# Patient Record
Sex: Female | Born: 1980 | Race: White | Hispanic: No | Marital: Married | Smoking: Former smoker
Health system: Southern US, Community
[De-identification: ages and names within clinical notes are randomized; demographics above are authoritative.]

## PROBLEM LIST (undated history)

## (undated) ENCOUNTER — Inpatient Hospital Stay (HOSPITAL_COMMUNITY): Payer: Self-pay

## (undated) DIAGNOSIS — Q210 Ventricular septal defect: Secondary | ICD-10-CM

## (undated) DIAGNOSIS — I471 Supraventricular tachycardia, unspecified: Secondary | ICD-10-CM

## (undated) DIAGNOSIS — E063 Autoimmune thyroiditis: Secondary | ICD-10-CM

## (undated) DIAGNOSIS — E039 Hypothyroidism, unspecified: Secondary | ICD-10-CM

## (undated) DIAGNOSIS — L709 Acne, unspecified: Secondary | ICD-10-CM

## (undated) HISTORY — DX: Ventricular septal defect: Q21.0

## (undated) HISTORY — DX: Acne, unspecified: L70.9

## (undated) HISTORY — DX: Autoimmune thyroiditis: E06.3

## (undated) HISTORY — PX: CARDIAC ELECTROPHYSIOLOGY MAPPING AND ABLATION: SHX1292

## (undated) HISTORY — PX: DILATION AND CURETTAGE OF UTERUS: SHX78

## (undated) HISTORY — DX: Supraventricular tachycardia, unspecified: I47.10

## (undated) HISTORY — PX: VSD REPAIR: SHX276

## (undated) HISTORY — DX: Supraventricular tachycardia: I47.1

## (undated) HISTORY — PX: CARDIAC CATHETERIZATION: SHX172

## (undated) HISTORY — PX: KNEE SURGERY: SHX244

---

## 2007-07-16 ENCOUNTER — Ambulatory Visit: Payer: Self-pay | Admitting: Internal Medicine

## 2007-07-28 ENCOUNTER — Ambulatory Visit: Payer: Self-pay | Admitting: Internal Medicine

## 2007-09-29 ENCOUNTER — Other Ambulatory Visit: Admission: RE | Admit: 2007-09-29 | Discharge: 2007-09-29 | Payer: Self-pay | Admitting: Gynecology

## 2008-11-17 ENCOUNTER — Emergency Department (HOSPITAL_COMMUNITY): Admission: EM | Admit: 2008-11-17 | Discharge: 2008-11-17 | Payer: Self-pay | Admitting: Emergency Medicine

## 2009-07-26 ENCOUNTER — Telehealth: Payer: Self-pay | Admitting: Internal Medicine

## 2009-10-06 ENCOUNTER — Ambulatory Visit (HOSPITAL_COMMUNITY): Admission: RE | Admit: 2009-10-06 | Discharge: 2009-10-06 | Payer: Self-pay | Admitting: Obstetrics and Gynecology

## 2009-10-15 ENCOUNTER — Inpatient Hospital Stay (HOSPITAL_COMMUNITY): Admission: AD | Admit: 2009-10-15 | Discharge: 2009-10-16 | Payer: Self-pay | Admitting: Obstetrics & Gynecology

## 2009-10-17 DEATH — deceased

## 2009-10-28 ENCOUNTER — Emergency Department (HOSPITAL_COMMUNITY): Admission: EM | Admit: 2009-10-28 | Discharge: 2009-10-28 | Payer: Self-pay | Admitting: Emergency Medicine

## 2010-08-20 ENCOUNTER — Inpatient Hospital Stay (HOSPITAL_COMMUNITY)
Admission: AD | Admit: 2010-08-20 | Discharge: 2010-08-23 | Payer: Self-pay | Source: Home / Self Care | Attending: Obstetrics and Gynecology | Admitting: Obstetrics and Gynecology

## 2010-08-24 ENCOUNTER — Inpatient Hospital Stay (HOSPITAL_COMMUNITY)
Admission: AD | Admit: 2010-08-24 | Discharge: 2010-08-24 | Payer: Self-pay | Source: Home / Self Care | Attending: Obstetrics and Gynecology | Admitting: Obstetrics and Gynecology

## 2010-09-10 ENCOUNTER — Inpatient Hospital Stay (HOSPITAL_COMMUNITY)
Admission: AD | Admit: 2010-09-10 | Discharge: 2010-09-10 | Payer: Self-pay | Source: Home / Self Care | Attending: Obstetrics and Gynecology | Admitting: Obstetrics and Gynecology

## 2010-09-12 ENCOUNTER — Inpatient Hospital Stay (HOSPITAL_COMMUNITY)
Admission: AD | Admit: 2010-09-12 | Discharge: 2010-09-12 | Payer: Self-pay | Source: Home / Self Care | Attending: Obstetrics and Gynecology | Admitting: Obstetrics and Gynecology

## 2010-09-13 ENCOUNTER — Inpatient Hospital Stay (HOSPITAL_COMMUNITY)
Admission: AD | Admit: 2010-09-13 | Discharge: 2010-09-15 | Payer: Self-pay | Source: Home / Self Care | Attending: Obstetrics and Gynecology | Admitting: Obstetrics and Gynecology

## 2010-09-13 DIAGNOSIS — E039 Hypothyroidism, unspecified: Secondary | ICD-10-CM

## 2010-09-13 DIAGNOSIS — E079 Disorder of thyroid, unspecified: Secondary | ICD-10-CM

## 2010-09-13 DIAGNOSIS — O99284 Endocrine, nutritional and metabolic diseases complicating childbirth: Secondary | ICD-10-CM

## 2010-11-26 LAB — URINALYSIS, ROUTINE W REFLEX MICROSCOPIC
Bilirubin Urine: NEGATIVE
Ketones, ur: NEGATIVE mg/dL
Nitrite: NEGATIVE
Urobilinogen, UA: 0.2 mg/dL (ref 0.0–1.0)

## 2010-11-26 LAB — CBC
HCT: 34.4 % — ABNORMAL LOW (ref 36.0–46.0)
Hemoglobin: 11.8 g/dL — ABNORMAL LOW (ref 12.0–15.0)
Hemoglobin: 12.6 g/dL (ref 12.0–15.0)
MCH: 30.1 pg (ref 26.0–34.0)
MCHC: 34.9 g/dL (ref 30.0–36.0)
Platelets: 161 10*3/uL (ref 150–400)
RBC: 3.88 MIL/uL (ref 3.87–5.11)
RDW: 12.7 % (ref 11.5–15.5)
RDW: 13 % (ref 11.5–15.5)
WBC: 12.3 10*3/uL — ABNORMAL HIGH (ref 4.0–10.5)

## 2010-11-26 LAB — RPR: RPR Ser Ql: NONREACTIVE

## 2010-11-26 LAB — ABO/RH: ABO/RH(D): O POS

## 2010-11-27 LAB — CBC
HCT: 36.4 % (ref 36.0–46.0)
MCH: 31.8 pg (ref 26.0–34.0)
MCHC: 34.1 g/dL (ref 30.0–36.0)
WBC: 12.4 10*3/uL — ABNORMAL HIGH (ref 4.0–10.5)

## 2010-11-27 LAB — RPR: RPR Ser Ql: NONREACTIVE

## 2010-12-03 LAB — CBC
Hemoglobin: 11.7 g/dL — ABNORMAL LOW (ref 12.0–15.0)
Hemoglobin: 13 g/dL (ref 12.0–15.0)
MCHC: 33.4 g/dL (ref 30.0–36.0)
MCHC: 33.7 g/dL (ref 30.0–36.0)
Platelets: 256 10*3/uL (ref 150–400)
RBC: 4.32 MIL/uL (ref 3.87–5.11)
RDW: 13.1 % (ref 11.5–15.5)
WBC: 9.8 10*3/uL (ref 4.0–10.5)

## 2010-12-03 LAB — URINALYSIS, ROUTINE W REFLEX MICROSCOPIC
Bilirubin Urine: NEGATIVE
Glucose, UA: NEGATIVE mg/dL
Ketones, ur: NEGATIVE mg/dL
Nitrite: NEGATIVE
Specific Gravity, Urine: 1.01 (ref 1.005–1.030)

## 2010-12-03 LAB — URINE MICROSCOPIC-ADD ON

## 2010-12-05 LAB — DIFFERENTIAL
Basophils Absolute: 0 10*3/uL (ref 0.0–0.1)
Basophils Relative: 0 % (ref 0–1)
Eosinophils Absolute: 0.2 10*3/uL (ref 0.0–0.7)
Eosinophils Relative: 2 % (ref 0–5)
Lymphs Abs: 2.7 10*3/uL (ref 0.7–4.0)
Neutrophils Relative %: 58 % (ref 43–77)

## 2010-12-05 LAB — URINALYSIS, ROUTINE W REFLEX MICROSCOPIC
Glucose, UA: NEGATIVE mg/dL
Ketones, ur: NEGATIVE mg/dL
Leukocytes, UA: NEGATIVE
Protein, ur: NEGATIVE mg/dL
Urobilinogen, UA: 1 mg/dL (ref 0.0–1.0)
pH: 6.5 (ref 5.0–8.0)

## 2010-12-05 LAB — URINE MICROSCOPIC-ADD ON

## 2010-12-05 LAB — BASIC METABOLIC PANEL
BUN: 8 mg/dL (ref 6–23)
CO2: 27 mEq/L (ref 19–32)
Creatinine, Ser: 0.63 mg/dL (ref 0.4–1.2)
Sodium: 137 mEq/L (ref 135–145)

## 2010-12-05 LAB — WET PREP, GENITAL: WBC, Wet Prep HPF POC: NONE SEEN

## 2010-12-05 LAB — CBC
MCHC: 34.8 g/dL (ref 30.0–36.0)
MCV: 90.1 fL (ref 78.0–100.0)
RDW: 13.2 % (ref 11.5–15.5)
WBC: 8 10*3/uL (ref 4.0–10.5)

## 2010-12-26 ENCOUNTER — Encounter: Payer: Self-pay | Admitting: Internal Medicine

## 2010-12-27 LAB — BASIC METABOLIC PANEL
CO2: 24 mEq/L (ref 19–32)
Calcium: 9.4 mg/dL (ref 8.4–10.5)
Creatinine, Ser: 0.65 mg/dL (ref 0.4–1.2)
GFR calc Af Amer: >60 mL/min (ref >60)
GFR calc non Af Amer: >60 mL/min (ref >60)
Sodium: 136 mEq/L (ref 135–145)

## 2010-12-27 LAB — CBC
Hemoglobin: 12.8 g/dL (ref 12.0–15.0)
MCHC: 35 g/dL (ref 30.0–36.0)
RBC: 4.11 MIL/uL (ref 3.87–5.11)
WBC: 7.4 10*3/uL (ref 4.0–10.5)

## 2010-12-27 LAB — DIFFERENTIAL
Basophils Relative: 0 % (ref 0–1)
Lymphocytes Relative: 38 % (ref 12–46)
Monocytes Absolute: 0.4 10*3/uL (ref 0.1–1.0)
Monocytes Relative: 5 % (ref 3–12)
Neutro Abs: 4.1 10*3/uL (ref 1.7–7.7)
Neutrophils Relative %: 56 % (ref 43–77)

## 2010-12-31 ENCOUNTER — Ambulatory Visit (INDEPENDENT_AMBULATORY_CARE_PROVIDER_SITE_OTHER): Payer: 59 | Admitting: Internal Medicine

## 2010-12-31 ENCOUNTER — Encounter: Payer: Self-pay | Admitting: Internal Medicine

## 2010-12-31 VITALS — BP 107/70 | HR 60 | Ht 66.0 in | Wt 130.0 lb

## 2010-12-31 DIAGNOSIS — Z9889 Other specified postprocedural states: Secondary | ICD-10-CM

## 2010-12-31 DIAGNOSIS — I471 Supraventricular tachycardia: Secondary | ICD-10-CM

## 2010-12-31 DIAGNOSIS — R002 Palpitations: Secondary | ICD-10-CM | POA: Insufficient documentation

## 2010-12-31 DIAGNOSIS — Z8774 Personal history of (corrected) congenital malformations of heart and circulatory system: Secondary | ICD-10-CM | POA: Insufficient documentation

## 2010-12-31 NOTE — Assessment & Plan Note (Signed)
Infrequent palpitations. At this point we'll not do anything.

## 2010-12-31 NOTE — Patient Instructions (Signed)
Your physician recommends that you schedule a follow-up appointment in: AS NEEDED  Your physician recommends that you continue on your current medications as directed. Please refer to the Current Medication list given to you today.  

## 2010-12-31 NOTE — Assessment & Plan Note (Signed)
There is no suggestion of the VSD repair is incomplete. She does have a murmur. I assume know that During evaluation at the [facility] [place] that there be something identified as relates to a persistent VSD. She'll look into her records

## 2010-12-31 NOTE — Progress Notes (Signed)
  HPI  Kaylee Ellis is a 30 y.o. female Seen after hiatus A 3-1/2 years for palpitations. Her cardiac history dates back to when she was 3. She had her VSD repair. He then had SVT and underwent ablation at the [facility] [place] for what was told her was unrelated to her VSD. When we saw her a couple years ago she was having palpitations. These have largely resolved as her life has become less stressful. She has occasional brief flutters that are aggravated by caffeine.  The patient denies chest pain, shortness of breath, nocturnal dyspnea, orthopnea or peripheral edema.  There have been no ightheadedness or syncope. ]   Past Medical History  Diagnosis Date  . Chest pain     history of, 2010  . Acne     history of  . Hashimoto disease     history of  . VSD (ventricular septal defect)     history of  . SVT (supraventricular tachycardia)     history of    Past Surgical History  Procedure Date  . Vsd repair     closure of ventricular septal defect  . Cardiac catheterization     for SVT    Current Outpatient Prescriptions  Medication Sig Dispense Refill  . levothyroxine (SYNTHROID, LEVOTHROID) 100 MCG tablet Take 100 mcg by mouth daily.        . Prenatal Vit-Fe Psac Cmplx-FA (PRENATAL MULTIVITAMIN) 60-1 MG tablet Take 1 tablet by mouth daily with breakfast.          No Known Allergies  Review of Systems negative except from HPI and PMH  Physical Exam Well developed and well nourished in no acute distress HENT normal E scleral and icterus clear Neck Supple JVP flat; carotids brisk and full Clear to ausculation Regular rate and rhythm, 2/6 murmur heard along the right sternal border Soft with active bowel sounds No clubbing cyanosis and edema Alert and oriented, grossly normal motor and sensory function Skin Warm and Dry  ECGSinus rhythm at 60 Intervals 0.16/1115.43 The axis is 60 Assessment and  Plan

## 2011-01-03 NOTE — Progress Notes (Signed)
Addended by: Deliah Goody on: 01/03/2011 12:25 PM   Modules accepted: Orders

## 2011-01-29 NOTE — Assessment & Plan Note (Signed)
[facility]                         ELECTROPHYSIOLOGY OFFICE NOTE   Kaylee, Ellis                        MRN:          [id number]  DATE:07/16/2007                            DOB:          [birth year]    Kaylee Ellis presents herself for new-patient evaluation.  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  Her cardiac history is notable for VSD repair at the age of 3  and an SVT ablation a few years ago, done in [place], for what she was  told was a problem unrelated to her VSD.  She has had recurrent problems  with palpitations of two sorts.  One is a slow heart rate, which she  notes when she feels fatigued, and the other is a fast heart rate that  lasts less than 5 seconds and is not really associated with any  significant symptoms.   Her other concern is midsternal chest pain.  Typically, it lasts less  than a couple of minutes.  It occurs most days, particularly during the  work day, and almost never happens on weekends.  It happens during the  work hours and mostly not at rest.  This has been going on for about a  month to a month and a half.   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  She describes her job as very  stressful.  It is capitalized on the back of her intake sheet.  As we  talked about it, she was tearful and apparently this is a daily  occurrence, given the situation.   PAST MEDICAL HISTORY:  Notable primarily as above.   She has a history of thyroid disease, for which she takes thyroid  replacement, and she takes p.r.n. beta blockers.   She has undergone annual echocardiography, most recently in May of this  year, demonstrating mild pulmonary hypertension with an estimated  pressure of 33.  The rest of the heart looked pretty normal.   PAST SURGICAL HISTORY:  In addition to the above, is notable for nine  knee surgeries.  She was a Public affairs consultant in high school.   She does  not use cigarettes or recreational drugs and she uses alcohol  rarely.  She does use caffeine, but she has noted no effect on her  symptoms.  She uses over-the-counter cold medicines occasionally with no  effect, and she does not use any other stimulants.   ON EXAMINATION:  She is a young, 30 year old Caucasian female, appearing  her stated age.  Her blood pressure is 102/67, her pulse is 62.  HEENT EXAM:  Demonstrated no icterus or xanthomata.  The neck veins were  flat.  The carotids were brisk and full bilaterally, without bruits.  BACK:  Without kyphosis or scoliosis.  LUNGS:  Clear.  HEART SOUNDS:  Regular, without murmurs or gallops.  ABDOMEN:  Soft with active bowel sounds, without midline pulsation or  hepatomegaly.  Femoral pulses were 2+, distal pulses were intact.  There was no  clubbing, cyanosis or edema.  NEUROLOGICAL EXAM:  Grossly normal.  SKIN:  Warm and dry.   Electrocardiogram, dated today, demonstrated sinus rhythm at 62 with  intervals of 0.17/0.11/0.42.  There was an R prime in lead V1.  There  were minor ST changes in the inferior and lateral leads and relatively  flat T-waves.   IMPRESSION:  1. Status post VSD repair.  2. Status post catheter ablation for SVT.  3. Chest pain, probably related to stress.  4. Palpitations, as noted above.      a.     Fast.      b.     Slow.  5. Significant psychosocial stress.   Ms. Kaylee Ellis has significant stress, which I think is manifested as chest  pain.  I do not think her chest pain is cardiac in origin.  I described  with the family the potential issues of trying to pursue a diagnostic  strategy and, in the setting of her abnormal electrocardiogram, this  would mean Myoview scanning and the potentials for a false positive  certainly outweigh the likelihood of a true positive in her and I think  that that is a rabbit hole I do not want to pursue.  They were  understanding.   As relates to her palpitations, we  discussed the use of an event loop  recorder, which we will undertake, and I will plan to see her again in  six weeks' time to review it.   As relates to her stress, I have encouraged her to try to find some  activities, such as exercise, which may help ameliorate stress.  I have  also offered to try and put her in touch with counseling or her primary  care physician might help her figure out ways to deal with that  situation.     Duke Salvia, MD, [facility]  Electronically Signed    SCK/MedQ  DD: 07/16/2007  DT: 07/17/2007  Job #: [id number]

## 2011-12-16 ENCOUNTER — Inpatient Hospital Stay (HOSPITAL_COMMUNITY): Payer: Managed Care, Other (non HMO)

## 2011-12-16 ENCOUNTER — Encounter (HOSPITAL_COMMUNITY): Payer: Self-pay | Admitting: *Deleted

## 2011-12-16 ENCOUNTER — Inpatient Hospital Stay (HOSPITAL_COMMUNITY)
Admission: AD | Admit: 2011-12-16 | Discharge: 2011-12-16 | Disposition: A | Discharge status: Hospice | Payer: Managed Care, Other (non HMO) | Source: Ambulatory Visit | Attending: Obstetrics and Gynecology | Admitting: Obstetrics and Gynecology

## 2011-12-16 DIAGNOSIS — O209 Hemorrhage in early pregnancy, unspecified: Secondary | ICD-10-CM | POA: Insufficient documentation

## 2011-12-16 DIAGNOSIS — O4690 Antepartum hemorrhage, unspecified, unspecified trimester: Secondary | ICD-10-CM

## 2011-12-16 LAB — URINE MICROSCOPIC-ADD ON

## 2011-12-16 LAB — URINALYSIS, ROUTINE W REFLEX MICROSCOPIC
Glucose, UA: NEGATIVE mg/dL
Protein, ur: NEGATIVE mg/dL
Specific Gravity, Urine: 1.025 (ref 1.005–1.030)
Urobilinogen, UA: 0.2 mg/dL (ref 0.0–1.0)

## 2011-12-16 LAB — WET PREP, GENITAL
Trich, Wet Prep: NONE SEEN
Yeast Wet Prep HPF POC: NONE SEEN

## 2011-12-16 NOTE — MAU Note (Signed)
Pt has been constipated for past 3 days; pt had an transvaginal u/s about 6 days ago;pt has had some spotting and cramping throughout the pregnancy; cramping is very slight rated 1-2 on the pain scale;

## 2011-12-16 NOTE — Discharge Instructions (Signed)
Vaginal Bleeding During Pregnancy, First Trimester  A small amount of bleeding (spotting) is relatively common in early pregnancy. It usually stops on its own. There are many causes for bleeding or spotting in early pregnancy. Some bleeding may be related to the pregnancy and some may not. Cramping with the bleeding is more serious and concerning. Tell your caregiver if you have any vaginal bleeding.   CAUSES    It is normal in most cases.   The pregnancy ends (miscarriage).   The pregnancy may end (threatened miscarriage).   Infection or inflammation of the cervix.   Growths (polyps) on the cervix.   Pregnancy happens outside of the uterus and in a fallopian tube (tubal pregnancy).   Many tiny cysts in the uterus instead of pregnancy tissue (molar pregnancy).  SYMPTOMS   Vaginal bleeding or spotting with or without cramps.  DIAGNOSIS   To evaluate the pregnancy, your caregiver may:   Do a pelvic exam.   Take blood tests.   Do an ultrasound.  It is very important to follow your caregiver's instructions.   TREATMENT    Evaluation of the pregnancy with blood tests and ultrasound.   Bed rest (getting up to use the bathroom only).   Rho-gam immunization if the mother is Rh negative and the father is Rh positive.  HOME CARE INSTRUCTIONS    If your caregiver orders bed rest, you may need to make arrangements for the care of other children and for other responsibilities. However, your caregiver may allow you to continue light activity.   Keep track of the number of pads you use each day, how often you change pads and how soaked (saturated) they are. Write this down.   Do not use tampons. Do not douche.   Do not have sexual intercourse or orgasms until approved by your physician.   Save any tissue that you pass for your caregiver to see.   Take medicine for cramps only with your caregiver's permission.   Do not take aspirin because it can make you bleed.  SEEK IMMEDIATE MEDICAL CARE IF:    You  experience severe cramps in your stomach, back or belly (abdomen).   You have an oral temperature above 102 F (38.9 C), not controlled by medicine.   You pass large clots or tissue.   Your bleeding increases or you become light-headed, weak or have fainting episodes.   You develop chills.   You are leaking or have a gush of fluid from your vagina.   You pass out while having a bowel movement. That may mean you have a ruptured tubal pregnancy.  Document Released: 06/12/2005 Document Revised: 08/22/2011 Document Reviewed: 12/22/2008  ExitCare Patient Information 2012 [employer].

## 2011-12-16 NOTE — MAU Note (Signed)
Pt reports she is [redacted] weeks pregnant, has been spotting the whole preg but it has increased now and is bright red. Cramping today. Marland Kitchen

## 2011-12-16 NOTE — MAU Provider Note (Signed)
Kaylee Ellis @[redacted]w[redacted]d  by LMP Chief Complaint  Patient presents with  . Vaginal Bleeding     First Provider Initiated Contact with Patient 12/16/11 2159      SUBJECTIVE  HPI: Pt presents to MAU with report of spotting throughout pregnancy but dark red bleeding noted today with some abdominal cramping.  She denies dizziness, h/a, n/v, urinary symptoms, vaginal itching/burning, or fever/chills.  Past Medical History  Diagnosis Date  . Chest pain     history of, 2010  . Acne     history of  . Hashimoto disease     history of  . VSD (ventricular septal defect)     history of  . SVT (supraventricular tachycardia)     history of   Past Surgical History  Procedure Date  . Vsd repair     closure of ventricular septal defect  . Cardiac catheterization     for SVT  . Dilation and curettage of uterus    History   Social History  . Marital Status: Married    Spouse Name: N/A    Number of Children: N/A  . Years of Education: N/A   Occupational History  . Not on file.   Social History Main Topics  . Smoking status: Former Games developer  . Smokeless tobacco: Not on file  . Alcohol Use: Yes     occasional  . Drug Use: No  . Sexually Active: Not on file   Other Topics Concern  . Not on file   Social History Narrative  . No narrative on file   No current facility-administered medications on file prior to encounter.   Current Outpatient Prescriptions on File Prior to Encounter  Medication Sig Dispense Refill  . levothyroxine (SYNTHROID, LEVOTHROID) 100 MCG tablet Take 100 mcg by mouth daily.         No Known Allergies  ROS: Pertinent items in HPI  OBJECTIVE Blood pressure 107/62, pulse 66, temperature 99.2 F (37.3 C), resp. rate 18, height 5\' 6"  (1.676 m), weight 58.968 kg (130 lb), last menstrual period 10/18/2011, SpO2 100.00%. GENERAL: Well-developed, well-nourished female in no acute distress.  LUNGS: Clear to auscultation bilaterally.  HEART:  Regular rate and rhythm. ABDOMEN: Soft, nontender EXTREMITIES: Nontender, no edema Pelvic exam:  Cervix pink, without lesion, moderate amount bright red bleeding pooling in vagina Bimanual exam:  Cervix 0/long/high, posterior, neg CMT, uterus nontender,  Slightly enlarged, adnexa without tenderness, enlargement or mass   LAB RESULTS Results for orders placed during the hospital encounter of 12/16/11 (from the past 24 hour(s))  URINALYSIS, ROUTINE W REFLEX MICROSCOPIC     Status: Abnormal   Collection Time   12/16/11  9:20 PM      Component Value Range   Color, Urine YELLOW  YELLOW    APPearance CLEAR  CLEAR    Specific Gravity, Urine 1.025  1.005 - 1.030    pH 5.5  5.0 - 8.0    Glucose, UA NEGATIVE  NEGATIVE (mg/dL)   Hgb urine dipstick LARGE (*) NEGATIVE    Bilirubin Urine NEGATIVE  NEGATIVE    Ketones, ur NEGATIVE  NEGATIVE (mg/dL)   Protein, ur NEGATIVE  NEGATIVE (mg/dL)   Urobilinogen, UA 0.2  0.0 - 1.0 (mg/dL)   Nitrite NEGATIVE  NEGATIVE    Leukocytes, UA SMALL (*) NEGATIVE   URINE MICROSCOPIC-ADD ON     Status: Abnormal   Collection Time   12/16/11  9:20 PM      Component Value Range  Squamous Epithelial / LPF FEW (*) RARE    WBC, UA 3-6  <3 (WBC/hpf)   RBC / HPF TOO NUMEROUS TO COUNT  <3 (RBC/hpf)  WET PREP, GENITAL     Status: Normal   Collection Time   12/16/11 10:00 PM      Component Value Range   Yeast Wet Prep HPF POC NONE SEEN  NONE SEEN    Trich, Wet Prep NONE SEEN  NONE SEEN    Clue Cells Wet Prep HPF POC NONE SEEN  NONE SEEN    WBC, Wet Prep HPF POC NONE SEEN  NONE SEEN     IMAGING US Ob Comp Less 14 Wks  12/16/2011  *RADIOLOGY REPORT*  Clinical Data: Bleeding and irregular cycles.  No quantitative beta HCG available.  Estimated gestational age by LMP is 8 weeks 3 days.  OBSTETRIC <14 WK ULTRASOUND  Technique:  Transabdominal ultrasound was performed for evaluation of the gestation as well as the maternal uterus and adnexal regions.  Comparison:  None.   Intrauterine gestational sac: A single intrauterine gestational sac is visualized.  Contours are regular. Yolk sac: Yolk sac is visualized, but appears somewhat larger than is expected.  This is of uncertain clinical significance.  Follow- up is recommended. Embryo: A single fetal pole is demonstrated. Cardiac Activity: Demonstrated. Heart Rate: 63 bpm  CRL:  4.5 mm  sixw  twod            [place] EDC: 08/08/2012  Maternal uterus/Adnexae: No evidence of subchorionic hemorrhage.  No focal myometrial masses.  Corpus luteal cyst on the right ovary.  Ovaries appear otherwise symmetrical.  No free pelvic fluid collections.  IMPRESSION: Single intrauterine gestational sac with fetal pole demonstrated. Heart rate is measured at 63 beats per minute which is somewhat low although this may be due to early gestational age.  The yolk sac is somewhat prominent in size.  This is of uncertain clinical significance.  Follow-up recommended.  Original Report Authenticated By: Marlon Pel, M.D.    ASSESSMENT Vaginal bleeding in pregnancy  PLAN Discussed results and assessment with Dr Arelia Sneddon D/C home with bleeding precautions F/U in office this week Return to MAU as needed

## 2011-12-17 ENCOUNTER — Encounter (HOSPITAL_COMMUNITY): Payer: Self-pay | Admitting: *Deleted

## 2011-12-17 ENCOUNTER — Inpatient Hospital Stay (HOSPITAL_COMMUNITY): Payer: Managed Care, Other (non HMO)

## 2011-12-17 ENCOUNTER — Inpatient Hospital Stay (HOSPITAL_COMMUNITY)
Admission: AD | Admit: 2011-12-17 | Discharge: 2011-12-17 | Disposition: A | Discharge status: Home / Self Care | Payer: Managed Care, Other (non HMO) | Source: Ambulatory Visit | Attending: Obstetrics and Gynecology | Admitting: Obstetrics and Gynecology

## 2011-12-17 DIAGNOSIS — O039 Complete or unspecified spontaneous abortion without complication: Secondary | ICD-10-CM | POA: Insufficient documentation

## 2011-12-17 LAB — CBC
Hemoglobin: 12.9 g/dL (ref 12.0–15.0)
Platelets: 241 10*3/uL (ref 150–400)
RBC: 4.35 MIL/uL (ref 3.87–5.11)
WBC: 10.8 10*3/uL — ABNORMAL HIGH (ref 4.0–10.5)

## 2011-12-17 MED ORDER — IBUPROFEN 600 MG PO TABS: 600.0000 mg | ORAL_TABLET | Freq: Four times a day (QID) | ORAL | Status: AC | PRN

## 2011-12-17 MED ORDER — IBUPROFEN 800 MG PO TABS
800.0000 mg | ORAL_TABLET | Freq: Once | ORAL | Status: AC
  Administered 2011-12-17: 800 mg via ORAL
  Filled 2011-12-17 (×2): qty 1

## 2011-12-17 NOTE — MAU Note (Signed)
Heart and poem given with emotional support-pt and husband appreciative

## 2011-12-17 NOTE — MAU Note (Signed)
PT SAYS SWAS HERE LAST NIGHT FOR SPOTTING AND CRAMPING.  WE DID U/S AND  SPEC EXAM-   FHR WAS LOW ON U/S..  THEN TODAY BLEEDING HAS INCREASED  WITH CLOTS-  NICKEL SIZE AND GOLF BALL SIZE.  CRAMPS ARE WORSE TODAY.     SHE CALLED OFFICE   AT 6PM -  NURSE TOLD HER TO COME HERE.  PAD   IN TRIAGE- LIGHT RED - WATERY  BLEEDING.

## 2011-12-17 NOTE — Discharge Instructions (Signed)
Miscarriage (Spontaneous Miscarriage)  A miscarriage is when you lose your baby before the twentieth week of pregnancy. Miscarriages happen in 15-20% of pregnancies. Most miscarriages happen in the first 13 weeks of the pregnancy. In medical terms, this is called a spontaneous miscarriage or early pregnancy loss. No further treatment is needed when the miscarriage is complete and all products of conception have been passed out of the body. You can begin trying for another pregnancy as soon as your caregiver says it is okay.  CAUSES    Most causes are not known.   Genetic problems like abnormal, not enough or too many chromosomes.   Infection of the cervix or uterus.   An abnormal shaped uterus, fibroid tumors or congenital abnormalities.   Hormone problems.   Medical problems.   Incompetent cervix, the tissue in the cervix is not strong enough to hold the pregnancy.   Smoking, too much alcohol use and illegal drugs.   Trauma.  SYMPTOMS    Bleeding or spotting from the vagina.   Cramping of the lower abdomen.   Passing of fluid from the vagina with or without cramps or pain.   Passing fetal tissue.  TREATMENT    Sometimes no further treatment is necessary if you pass all the tissue in the uterus.   If partial parts of the fetus or placenta remain in the body (incomplete miscarriage), tissue left behind may become infected. Usually a D and C (Dilatation and Curettage) suction or scrapping of the uterus is necessary to remove the remaining tissue in uterus. The procedure is only done when your caregiver knows that there is no chance for the pregnancy to continue. This is determined by a physical exam, a negative pregnancy test, blood tests and perhaps an ultrasound revealing a dead fetus or no fetus developing because a problem occurred at conception (when the sperm and egg unite).   Medications may be necessary, antibiotics if there is an infection or medications to contract the uterus if there is a  lot of bleeding.   If you have Rh negative blood and your partner is Rh positive, you will need a Rho-gam shot (an immune globulin vaccine). This will protect your baby from having Rh blood problems in future pregnancies.  HOME CARE INSTRUCTIONS    Your caregiver may order bed rest (up to the bathroom only). He or she may allow you to continue light activity. You may need to make arrangements for the care of children and for any other responsibilities.   Keep track of the number of pads you use each day and how soaked (saturated) they are. Record this information.   Do not use tampons. Do not douche or have sexual intercourse until approved by your caregiver.   Only take over-the-counter or prescription medicines for pain, discomfort or fever as directed by your caregiver.   Do not take aspirin because it can cause bleeding.   It is very important to keep all follow-up appointments for re-evaluations and continuing management.   Tell your caregiver if you are experiencing domestic violence.   Women who have an Rh negative blood type (i.e., A, B, AB, or O negative) need to receive a drug called Rh(D) immune globulin (RhoGam). This medicine helps protect future fetuses against problems that can occur if an Rh negative mother is carrying a baby who is Rh positive.   If you and/or your partner are having problems with guilt or grieving, talk to your caregiver or seek counseling to help   you cope with the pregnancy loss. Allow enough time to grieve before trying to get pregnant again.  SEEK IMMEDIATE MEDICAL CARE IF:    You have severe cramps or pain in your stomach, back, or belly (abdomen).   You have a fever.   You pass large clots or tissue. Save any tissue for your caregiver to inspect.   Your bleeding increases.   You become light-headed, weak or have fainting episodes.   You develop chills.  Document Released: 02/26/2001 Document Revised: 08/22/2011 Document Reviewed: 04/04/2008  [employer] Patient  Information 2012 [employer].

## 2011-12-17 NOTE — MAU Provider Note (Signed)
History     CSN: [id number]  Arrival date and time: 12/17/11 3086   First Provider Initiated Contact with Patient 12/17/11 2108      No chief complaint on file.  HPI 31 y.o. [id number] at [redacted]w[redacted]d with vaginal bleeding and cramping. Seen yesterday in MAU with bleeding, 6.2 week IUP with FHR 63. Bleeding increased today, passed "golf ball sized clot", bleeding has slowed now.     Past Medical History  Diagnosis Date  . Chest pain     history of, 2010  . Acne     history of  . Hashimoto disease     history of  . VSD (ventricular septal defect)     history of  . SVT (supraventricular tachycardia)     history of    Past Surgical History  Procedure Date  . Vsd repair     closure of ventricular septal defect  . Cardiac catheterization     for SVT  . Dilation and curettage of uterus     No family history on file.  History  Substance Use Topics  . Smoking status: Former Games developer  . Smokeless tobacco: Not on file  . Alcohol Use: Yes     occasional    Allergies: No Known Allergies  Prescriptions prior to admission  Medication Sig Dispense Refill  . levothyroxine (SYNTHROID, LEVOTHROID) 100 MCG tablet Take 100 mcg by mouth daily.        . Prenatal Vit-Fe Fumarate-FA (PRENATAL MULTIVITAMIN) TABS Take 1 tablet by mouth daily.        Review of Systems  Constitutional: Negative.   Respiratory: Negative.   Cardiovascular: Negative.   Gastrointestinal: Negative for nausea, vomiting, abdominal pain, diarrhea and constipation.  Genitourinary: Negative for dysuria, urgency, frequency, hematuria and flank pain.       Positive for vaginal bleeding and cramping  Musculoskeletal: Negative.   Neurological: Negative.   Psychiatric/Behavioral: Negative.    Physical Exam   Blood pressure 106/64, pulse 68, temperature 98.5 F (36.9 C), temperature source Oral, resp. rate 20, height 5\' 5"  (1.651 m), weight 128 lb 2 oz (58.117 kg), last menstrual period 10/18/2011.  Physical Exam    Nursing note and vitals reviewed. Constitutional: She is oriented to person, place, and time. She appears well-developed and well-nourished. No distress.  Cardiovascular: Normal rate and regular rhythm.   Respiratory: Effort normal.  GI: Soft. There is no tenderness.  Genitourinary: There is bleeding (moderate) around the vagina.  Musculoskeletal: Normal range of motion.  Neurological: She is alert and oriented to person, place, and time.  Skin: Skin is warm and dry.  Psychiatric: She has a normal mood and affect.    MAU Course  Procedures  Results for orders placed during the hospital encounter of 12/17/11 (from the past 24 hour(s))  CBC     Status: Abnormal   Collection Time   12/17/11  7:45 PM      Component Value Range   WBC 10.8 (*) 4.0 - 10.5 (K/uL)   RBC 4.35  3.87 - 5.11 (MIL/uL)   Hemoglobin 12.9  12.0 - 15.0 (g/dL)   HCT 29.5  28.4 - 13.2 (%)   MCV 88.3  78.0 - 100.0 (fL)   MCH 29.7  26.0 - 34.0 (pg)   MCHC 33.6  30.0 - 36.0 (g/dL)   RDW 44.0  10.2 - 72.5 (%)   Platelets 241  150 - 400 (K/uL)   US Ob Comp Less 14 Wks  12/16/2011  *RADIOLOGY REPORT*  Clinical Data: Bleeding and irregular cycles.  No quantitative beta HCG available.  Estimated gestational age by LMP is 8 weeks 3 days.  OBSTETRIC <14 WK ULTRASOUND  Technique:  Transabdominal ultrasound was performed for evaluation of the gestation as well as the maternal uterus and adnexal regions.  Comparison:  None.  Intrauterine gestational sac: A single intrauterine gestational sac is visualized.  Contours are regular. Yolk sac: Yolk sac is visualized, but appears somewhat larger than is expected.  This is of uncertain clinical significance.  Follow- up is recommended. Embryo: A single fetal pole is demonstrated. Cardiac Activity: Demonstrated. Heart Rate: 63 bpm  CRL:  4.5 mm  sixw  twod            [place] EDC: 08/08/2012  Maternal uterus/Adnexae: No evidence of subchorionic hemorrhage.  No focal myometrial masses.  Corpus luteal  cyst on the right ovary.  Ovaries appear otherwise symmetrical.  No free pelvic fluid collections.  IMPRESSION: Single intrauterine gestational sac with fetal pole demonstrated. Heart rate is measured at 63 beats per minute which is somewhat low although this may be due to early gestational age.  The yolk sac is somewhat prominent in size.  This is of uncertain clinical significance.  Follow-up recommended.  Original Report Authenticated By: Marlon Pel, M.D.   US Ob Transvaginal  12/17/2011  *RADIOLOGY REPORT*  Clinical Data: Vaginal bleeding.  TRANSVAGINAL OB ULTRASOUND  Technique:  Transvaginal ultrasound was performed for evaluation of the gestation as well as the maternal uterus and adnexal regions.  Comparison: 12/16/2011  Findings: No residual endometrial gestational sac is currently observed.  The endometrial stripe measures 1.3 cm in thickness, moderately hyperechoic, compatible with blood products.  The left ovary measures 2.6 x 1.2 x 1.4 cm and appears normal.  The right ovary measures 2.7 x 1.6 x 1.5 cm, with a corpus luteum measuring up to 1.4 cm.  Blood products in the cervical canal measure up to 8 mm in thickness.  Trace free pelvic fluid noted.  IMPRESSION:  1.  Complex blood products in the endometrial canal and cervical canal.  No recognizable gestational sac is currently identified. The appearance is compatible with spontaneous abortion.  No mass- like appearance to suggest overt retained products of conception.  Original Report Authenticated By: Dellia Cloud, M.D.   Motrin 800 mg given in MAU for cramping.   Assessment and Plan  31 y.o. [id number] at [redacted]w[redacted]d with complete SAB Bleeding, VS and CBC stable Precautions rev'd, keep scheduled appointment tomorrow, return to MAU with excessive bleeding or pain  Jasir Rother 12/17/2011, 9:21 PM

## 2011-12-18 NOTE — MAU Provider Note (Signed)
Chart reviewed and agree with management and plan.  

## 2011-12-21 ENCOUNTER — Inpatient Hospital Stay (HOSPITAL_COMMUNITY): Payer: Managed Care, Other (non HMO)

## 2011-12-21 ENCOUNTER — Inpatient Hospital Stay (HOSPITAL_COMMUNITY)
Admission: AD | Admit: 2011-12-21 | Discharge: 2011-12-21 | Disposition: A | Discharge status: Home / Self Care | Payer: Managed Care, Other (non HMO) | Source: Ambulatory Visit | Attending: Obstetrics and Gynecology | Admitting: Obstetrics and Gynecology

## 2011-12-21 ENCOUNTER — Encounter (HOSPITAL_COMMUNITY): Payer: Self-pay

## 2011-12-21 DIAGNOSIS — O039 Complete or unspecified spontaneous abortion without complication: Secondary | ICD-10-CM

## 2011-12-21 DIAGNOSIS — R109 Unspecified abdominal pain: Secondary | ICD-10-CM | POA: Insufficient documentation

## 2011-12-21 DIAGNOSIS — O209 Hemorrhage in early pregnancy, unspecified: Secondary | ICD-10-CM | POA: Insufficient documentation

## 2011-12-21 LAB — CBC
HCT: 34.9 % — ABNORMAL LOW (ref 36.0–46.0)
Hemoglobin: 11.4 g/dL — ABNORMAL LOW (ref 12.0–15.0)
MCV: 89.5 fL (ref 78.0–100.0)
RBC: 3.9 MIL/uL (ref 3.87–5.11)
WBC: 6.3 10*3/uL (ref 4.0–10.5)

## 2011-12-21 MED ORDER — METHYLERGONOVINE MALEATE 0.2 MG PO TABS: 0.2000 mg | ORAL_TABLET | Freq: Four times a day (QID) | ORAL | Status: DC

## 2011-12-21 NOTE — MAU Provider Note (Signed)
History     CSN: [id number]  Arrival date & time 12/21/11  0847   None     Chief Complaint  Patient presents with  . Vaginal Bleeding  . Abdominal Pain    HPI Kaylee Ellis is a 31 y.o. female @ [redacted]w[redacted]d gestation who presents to MAU for vaginal bleeding. She had an ultrasound 5 days ago that showed an irregular IUGS and returned the following day with increased bleeding and ultrasound showed no IUGS and no indication of RPOC. Today the patient reports increased bleeding and passing clots. The history was provided by the patient.  Past Medical History  Diagnosis Date  . Chest pain     history of, 2010  . Acne     history of  . Hashimoto disease     history of  . VSD (ventricular septal defect)     history of  . SVT (supraventricular tachycardia)     history of    Past Surgical History  Procedure Date  . Vsd repair     closure of ventricular septal defect  . Cardiac catheterization     for SVT  . Dilation and curettage of uterus     No family history on file.  History  Substance Use Topics  . Smoking status: Former Games developer  . Smokeless tobacco: Not on file  . Alcohol Use: Yes     occasional    OB History    Grav Para Term Preterm Abortions TAB SAB Ect Mult Living   3 1  1 1  0 1   1      Review of Systems  Constitutional: Negative for fever, chills, diaphoresis and fatigue.  HENT: Negative for ear pain, congestion, sore throat, facial swelling, neck pain, neck stiffness, dental problem and sinus pressure.   Eyes: Negative for photophobia, pain and discharge.  Respiratory: Positive for cough. Negative for chest tightness and wheezing.   Cardiovascular: Negative.   Gastrointestinal: Positive for nausea and abdominal pain. Negative for vomiting, diarrhea, constipation and abdominal distention.  Genitourinary: Positive for vaginal bleeding. Negative for dysuria, frequency, flank pain, vaginal discharge and difficulty urinating.  Musculoskeletal: Positive for  back pain. Negative for myalgias and gait problem.  Skin: Negative for color change and rash.  Neurological: Negative for dizziness, speech difficulty, weakness, light-headedness, numbness and headaches.  Psychiatric/Behavioral: Negative for confusion and agitation. The patient is not nervous/anxious.     Allergies  Review of patient's allergies indicates no known allergies.  Home Medications  No current outpatient prescriptions on file.  BP 101/50  Pulse 58  Temp(Src) 98.9 F (37.2 C) (Oral)  Resp 16  LMP 10/18/2011  Physical Exam  Nursing note and vitals reviewed. Constitutional: She is oriented to person, place, and time. She appears well-developed and well-nourished. No distress.  HENT:  Head: Normocephalic.  Eyes: EOM are normal.  Neck: Neck supple.  Cardiovascular: Normal rate.   Pulmonary/Chest: Effort normal.  Abdominal: Soft. There is no tenderness.  Genitourinary:       External genitalia without lesions, moderate blood vaginal vault. Clot noted in cervix, removed with ring forceps.  No CMT, no adnexal tenderness. Uterus without palpable enlargement.  Musculoskeletal: Normal range of motion.  Neurological: She is alert and oriented to person, place, and time. No cranial nerve deficit.  Skin: Skin is warm and dry.  Psychiatric: Her behavior is normal. Judgment and thought content normal. Her mood appears anxious.   Results for orders placed during the hospital encounter of 12/21/11 (from  the past 24 hour(s))  CBC     Status: Abnormal   Collection Time   12/21/11 10:09 AM      Component Value Range   WBC 6.3  4.0 - 10.5 (K/uL)   RBC 3.90  3.87 - 5.11 (MIL/uL)   Hemoglobin 11.4 (*) 12.0 - 15.0 (g/dL)   HCT 16.1 (*) 09.6 - 46.0 (%)   MCV 89.5  78.0 - 100.0 (fL)   MCH 29.2  26.0 - 34.0 (pg)   MCHC 32.7  30.0 - 36.0 (g/dL)   RDW 04.5  40.9 - 81.1 (%)   Platelets 200  150 - 400 (K/uL)   US Ob Transvaginal  12/21/2011  *RADIOLOGY REPORT*  Clinical Data: 31 year old  female with spontaneous abortion.  Heavy bleeding, passing clots.  TRANSVAGINAL OBSTETRIC US  Technique:  Transvaginal ultrasound was performed for complete evaluation of the gestation as well as the maternal uterus, adnexal regions, and pelvic cul-de-sac.  Comparison:  12/17/2011 and earlier.  Intrauterine gestational sac: None Yolk sac: None Embryo: None Cardiac Activity: None Subchorionic hemorrhage: None.  Maternal uterus/adnexae: Endometrial thickness of 6 mm.  The endometrial canal appears glands. no hypervascularity evident on power Doppler.  Small volume of pelvic free fluid appears simple (image 19).  The left ovary measures 2.4 x 1.4 x 1.6 cm (not significantly changed). No hypervascularity evident on power Doppler.  Right ovary appears within normal limits, previously seen multiple small follicles have slightly increased in size.  The right ovary measures 3.3 x 2.0 x 2.6 cm.  IMPRESSION: 1. No IUP and bland appearance of the endometrium; no evidence of retained products of conception. 2.  Essentially stable appearance of the ovaries. 3.  Small volume of fairly simple appearing pelvic free fluid.  Original Report Authenticated By: Harley Hallmark, M.D.   Assessment: Vaginal bleeding s/p SAB  Plan:  Methergine 0.2 mg po every 6 hours for 2 days ED Course  Procedures    MDM

## 2011-12-21 NOTE — Discharge Instructions (Signed)
Take the medication as directed. Follow up in the office. Return here as needed.  Miscarriage (Spontaneous Miscarriage) A miscarriage is when you lose your baby before the twentieth week of pregnancy. Miscarriages happen in 15-20% of pregnancies. Most miscarriages happen in the first 13 weeks of the pregnancy. In medical terms, this is called a spontaneous miscarriage or early pregnancy loss. No further treatment is needed when the miscarriage is complete and all products of conception have been passed out of the body. You can begin trying for another pregnancy as soon as your caregiver says it is okay. CAUSES   Most causes are not known.   Genetic problems like abnormal, not enough or too many chromosomes.   Infection of the cervix or uterus.   An abnormal shaped uterus, fibroid tumors or congenital abnormalities.   Hormone problems.   Medical problems.   Incompetent cervix, the tissue in the cervix is not strong enough to hold the pregnancy.   Smoking, too much alcohol use and illegal drugs.   Trauma.  SYMPTOMS   Bleeding or spotting from the vagina.   Cramping of the lower abdomen.   Passing of fluid from the vagina with or without cramps or pain.   Passing fetal tissue.  TREATMENT   Sometimes no further treatment is necessary if you pass all the tissue in the uterus.   If partial parts of the fetus or placenta remain in the body (incomplete miscarriage), tissue left behind may become infected. Usually a D and C (Dilatation and Curettage) suction or scrapping of the uterus is necessary to remove the remaining tissue in uterus. The procedure is only done when your caregiver knows that there is no chance for the pregnancy to continue. This is determined by a physical exam, a negative pregnancy test, blood tests and perhaps an ultrasound revealing a dead fetus or no fetus developing because a problem occurred at conception (when the sperm and egg unite).   Medications may be  necessary, antibiotics if there is an infection or medications to contract the uterus if there is a lot of bleeding.   If you have Rh negative blood and your partner is Rh positive, you will need a Rho-gam shot (an immune globulin vaccine). This will protect your baby from having Rh blood problems in future pregnancies.  HOME CARE INSTRUCTIONS   Your caregiver may order bed rest (up to the bathroom only). He or she may allow you to continue light activity. You may need to make arrangements for the care of children and for any other responsibilities.   Keep track of the number of pads you use each day and how soaked (saturated) they are. Record this information.   Do not use tampons. Do not douche or have sexual intercourse until approved by your caregiver.   Only take over-the-counter or prescription medicines for pain, discomfort or fever as directed by your caregiver.   Do not take aspirin because it can cause bleeding.   It is very important to keep all follow-up appointments for re-evaluations and continuing management.   Tell your caregiver if you are experiencing domestic violence.   Women who have an Rh negative blood type (i.e., A, B, AB, or O negative) need to receive a drug called Rh(D) immune globulin (RhoGam). This medicine helps protect future fetuses against problems that can occur if an Rh negative mother is carrying a baby who is Rh positive.   If you and/or your partner are having problems with guilt or grieving,  talk to your caregiver or seek counseling to help you cope with the pregnancy loss. Allow enough time to grieve before trying to get pregnant again.  SEEK IMMEDIATE MEDICAL CARE IF:   You have severe cramps or pain in your stomach, back, or belly (abdomen).   You have a fever.   You pass large clots or tissue. Save any tissue for your caregiver to inspect.   Your bleeding increases.   You become light-headed, weak or have fainting episodes.   You develop  chills.  Document Released: 02/26/2001 Document Revised: 08/22/2011 Document Reviewed: 04/04/2008 [facility] Patient Information 2012 [place], [place].

## 2011-12-21 NOTE — MAU Note (Signed)
Was told had miscarriage on Tuesday today bleeding is heavy with passing clots, changing a pad an hour.

## 2012-07-09 LAB — OB RESULTS CONSOLE RPR: RPR: NONREACTIVE

## 2012-07-09 LAB — OB RESULTS CONSOLE HIV ANTIBODY (ROUTINE TESTING): HIV: NONREACTIVE

## 2012-07-09 LAB — OB RESULTS CONSOLE GC/CHLAMYDIA: Chlamydia: NEGATIVE

## 2012-07-09 LAB — OB RESULTS CONSOLE HEPATITIS B SURFACE ANTIGEN: Hepatitis B Surface Ag: NEGATIVE

## 2012-08-04 ENCOUNTER — Telehealth (HOSPITAL_COMMUNITY): Payer: Self-pay | Admitting: *Deleted

## 2012-08-04 ENCOUNTER — Encounter (HOSPITAL_COMMUNITY): Payer: Self-pay | Admitting: *Deleted

## 2012-08-04 NOTE — Telephone Encounter (Signed)
Resolve episode 

## 2012-08-22 ENCOUNTER — Inpatient Hospital Stay (HOSPITAL_COMMUNITY)
Admission: AD | Admit: 2012-08-22 | Discharge: 2012-08-23 | Disposition: A | Discharge status: Home / Self Care | Payer: Managed Care, Other (non HMO) | Source: Ambulatory Visit | Attending: Obstetrics and Gynecology | Admitting: Obstetrics and Gynecology

## 2012-08-22 ENCOUNTER — Encounter (HOSPITAL_COMMUNITY): Payer: Self-pay | Admitting: *Deleted

## 2012-08-22 DIAGNOSIS — O209 Hemorrhage in early pregnancy, unspecified: Secondary | ICD-10-CM

## 2012-08-22 LAB — URINE MICROSCOPIC-ADD ON

## 2012-08-22 LAB — URINALYSIS, ROUTINE W REFLEX MICROSCOPIC
Bilirubin Urine: NEGATIVE
Leukocytes, UA: NEGATIVE
Nitrite: NEGATIVE
Specific Gravity, Urine: 1.015 (ref 1.005–1.030)
pH: 8.5 — ABNORMAL HIGH (ref 5.0–8.0)

## 2012-08-22 NOTE — MAU Provider Note (Signed)
  History     CSN: [id number]  Arrival date and time: 08/22/12 2252   First Provider Initiated Contact with Patient 08/22/12 2353      Chief Complaint  Patient presents with  . Vaginal Bleeding   HPI  Kaylee Ellis is a 31 y.o. [id number] with a history of pre-term delivery at 36 weeks. She has been using progesterone vaginally. She stopped that at 10 weeks. She did not have any bleeding prior to 10 weeks.   Past Medical History  Diagnosis Date  . Chest pain     history of, 2010  . Acne     history of  . Hashimoto disease     history of  . VSD (ventricular septal defect)     history of  . SVT (supraventricular tachycardia)     history of    Past Surgical History  Procedure Date  . Vsd repair     closure of ventricular septal defect  . Cardiac catheterization     for SVT  . Dilation and curettage of uterus     History reviewed. No pertinent family history.  History  Substance Use Topics  . Smoking status: Former Games developer  . Smokeless tobacco: Not on file  . Alcohol Use: Yes     Comment: occasional    Allergies: No Known Allergies  Prescriptions prior to admission  Medication Sig Dispense Refill  . fish oil-omega-3 fatty acids 1000 MG capsule Take 2 g by mouth daily.      Marland Kitchen levothyroxine (SYNTHROID, LEVOTHROID) 100 MCG tablet Take 100 mcg by mouth daily.        . Prenatal Vit-Fe Fumarate-FA (PRENATAL MULTIVITAMIN) TABS Take 1 tablet by mouth daily.      . methylergonovine (METHERGINE) 0.2 MG tablet Take 1 tablet (0.2 mg total) by mouth every 6 (six) hours.  12 tablet  0  . oxyCODONE-acetaminophen (PERCOCET) 5-325 MG per tablet Take 1 tablet by mouth every 4 (four) hours as needed. For severe pain        Review of Systems  Constitutional: Negative for fever and chills.  Gastrointestinal: Positive for constipation. Negative for nausea, vomiting, abdominal pain and diarrhea.  Genitourinary: Negative for dysuria, urgency and frequency.   Physical Exam    Blood pressure 113/66, pulse 69, temperature 97.9 F (36.6 C), temperature source Oral, resp. rate 18, last menstrual period 10/18/2011, unknown if currently breastfeeding.  Physical Exam  Nursing note and vitals reviewed. Constitutional: She is oriented to person, place, and time. She appears well-developed and well-nourished.  Cardiovascular: Normal rate.   Respiratory: Effort normal.  GI: Soft.  Neurological: She is alert and oriented to person, place, and time.  Skin: Skin is warm and dry.    MAU Course  Procedures  0022: Spoke with Dr. Rana Snare. Ultrasound ordered. OK for D/C if ultrasound is OK  Assessment and Plan   1. Vaginal bleeding before [redacted] weeks gestation    FU with PCP next week Bleeding precautions/danger signs reviewed.   Tawnya Crook 08/22/2012, 11:54 PM

## 2012-08-22 NOTE — MAU Note (Signed)
Spotting that started around 1015 pm. No cramping or vaginal discharge.

## 2012-08-23 ENCOUNTER — Inpatient Hospital Stay (HOSPITAL_COMMUNITY): Payer: Managed Care, Other (non HMO)

## 2012-08-23 DIAGNOSIS — O209 Hemorrhage in early pregnancy, unspecified: Secondary | ICD-10-CM

## 2012-08-23 NOTE — Progress Notes (Signed)
Zorita Pang CNM in earlier and discussed u/s results and d/c plan. Written and verbal d/c instructions given and understanding voiced.

## 2012-08-23 NOTE — Progress Notes (Signed)
Written and verbal d/c instructions given and understanding voiced. 

## 2012-09-16 NOTE — L&D Delivery Note (Signed)
Delivery Note At 8:33 AM a viable female was delivered via Vaginal, Spontaneous Delivery (Presentation: Left Occiput Anterior).  APGAR: and weight pending Placenta status: Intact, Spontaneous Pathology.  Cord: 3 vessels with the following complications: None.  Cord pH: none  Anesthesia: Local  Episiotomy: None Lacerations: Labial Suture Repair: 3.0 chromic Est. Blood Loss (mL): 300  Mom to postpartum.  Baby to NICU.  Malikah Lakey L 01/09/2013, 8:53 AM

## 2013-01-09 ENCOUNTER — Inpatient Hospital Stay (HOSPITAL_COMMUNITY)
Admission: AD | Admit: 2013-01-09 | Discharge: 2013-01-11 | DRG: 775 | Disposition: A | Discharge status: Home / Self Care | Payer: PRIVATE HEALTH INSURANCE | Source: Ambulatory Visit | Attending: Obstetrics and Gynecology | Admitting: Obstetrics and Gynecology

## 2013-01-09 ENCOUNTER — Encounter (HOSPITAL_COMMUNITY): Payer: Self-pay | Admitting: *Deleted

## 2013-01-09 DIAGNOSIS — O4290 Premature rupture of membranes, unspecified as to length of time between rupture and onset of labor, unspecified weeks of gestation: Secondary | ICD-10-CM | POA: Diagnosis present

## 2013-01-09 DIAGNOSIS — I471 Supraventricular tachycardia: Secondary | ICD-10-CM

## 2013-01-09 DIAGNOSIS — Z8774 Personal history of (corrected) congenital malformations of heart and circulatory system: Secondary | ICD-10-CM

## 2013-01-09 DIAGNOSIS — R002 Palpitations: Secondary | ICD-10-CM

## 2013-01-09 HISTORY — DX: Hypothyroidism, unspecified: E03.9

## 2013-01-09 LAB — CBC
Hemoglobin: 11.8 g/dL — ABNORMAL LOW (ref 12.0–15.0)
MCV: 88.8 fL (ref 78.0–100.0)
Platelets: 157 10*3/uL (ref 150–400)
RBC: 3.92 MIL/uL (ref 3.87–5.11)
WBC: 10.8 10*3/uL — ABNORMAL HIGH (ref 4.0–10.5)

## 2013-01-09 MED ORDER — MEASLES, MUMPS & RUBELLA VAC ~~LOC~~ INJ
0.5000 mL | INJECTION | Freq: Once | SUBCUTANEOUS | Status: DC
  Filled 2013-01-09: qty 0.5

## 2013-01-09 MED ORDER — LACTATED RINGERS IV SOLN: 500.0000 mL | INTRAVENOUS | Status: DC | PRN

## 2013-01-09 MED ORDER — ONDANSETRON HCL 4 MG/2ML IJ SOLN: 4.0000 mg | Freq: Four times a day (QID) | INTRAMUSCULAR | Status: DC | PRN

## 2013-01-09 MED ORDER — FLEET ENEMA 7-19 GM/118ML RE ENEM: 1.0000 | ENEMA | RECTAL | Status: DC | PRN

## 2013-01-09 MED ORDER — ONDANSETRON HCL 4 MG PO TABS: 4.0000 mg | ORAL_TABLET | ORAL | Status: DC | PRN

## 2013-01-09 MED ORDER — CITRIC ACID-SODIUM CITRATE 334-500 MG/5ML PO SOLN: 30.0000 mL | ORAL | Status: DC | PRN

## 2013-01-09 MED ORDER — ACETAMINOPHEN 325 MG PO TABS: 650.0000 mg | ORAL_TABLET | ORAL | Status: DC | PRN

## 2013-01-09 MED ORDER — LANOLIN HYDROUS EX OINT: TOPICAL_OINTMENT | CUTANEOUS | Status: DC | PRN

## 2013-01-09 MED ORDER — BISACODYL 10 MG RE SUPP
10.0000 mg | Freq: Every day | RECTAL | Status: DC | PRN
  Filled 2013-01-09: qty 1

## 2013-01-09 MED ORDER — IBUPROFEN 600 MG PO TABS
600.0000 mg | ORAL_TABLET | Freq: Four times a day (QID) | ORAL | Status: DC | PRN
  Administered 2013-01-09: 600 mg via ORAL
  Filled 2013-01-09: qty 1

## 2013-01-09 MED ORDER — DIPHENHYDRAMINE HCL 25 MG PO CAPS: 25.0000 mg | ORAL_CAPSULE | Freq: Four times a day (QID) | ORAL | Status: DC | PRN

## 2013-01-09 MED ORDER — MEDROXYPROGESTERONE ACETATE 150 MG/ML IM SUSP: 150.0000 mg | INTRAMUSCULAR | Status: DC | PRN

## 2013-01-09 MED ORDER — LACTATED RINGERS IV SOLN
INTRAVENOUS | Status: DC
  Administered 2013-01-09: 05:00:00 via INTRAVENOUS

## 2013-01-09 MED ORDER — PRENATAL MULTIVITAMIN CH
1.0000 | ORAL_TABLET | Freq: Every day | ORAL | Status: DC
  Administered 2013-01-09 – 2013-01-11 (×3): 1 via ORAL
  Filled 2013-01-09 (×3): qty 1

## 2013-01-09 MED ORDER — DEXTROSE 5 % IV SOLN
5.0000 10*6.[IU] | Freq: Once | INTRAVENOUS | Status: AC
  Administered 2013-01-09: 5 10*6.[IU] via INTRAVENOUS
  Filled 2013-01-09: qty 5

## 2013-01-09 MED ORDER — LEVOTHYROXINE SODIUM 100 MCG PO TABS
100.0000 ug | ORAL_TABLET | Freq: Every day | ORAL | Status: DC
  Administered 2013-01-09 – 2013-01-11 (×3): 100 ug via ORAL
  Filled 2013-01-09 (×3): qty 1

## 2013-01-09 MED ORDER — WITCH HAZEL-GLYCERIN EX PADS: 1.0000 "application " | MEDICATED_PAD | CUTANEOUS | Status: DC | PRN

## 2013-01-09 MED ORDER — OXYTOCIN 40 UNITS IN LACTATED RINGERS INFUSION - SIMPLE MED
62.5000 mL/h | INTRAVENOUS | Status: DC
  Administered 2013-01-09: 62.5 mL/h via INTRAVENOUS
  Filled 2013-01-09: qty 1000

## 2013-01-09 MED ORDER — FLEET ENEMA 7-19 GM/118ML RE ENEM: 1.0000 | ENEMA | Freq: Every day | RECTAL | Status: DC | PRN

## 2013-01-09 MED ORDER — LIDOCAINE HCL (PF) 1 % IJ SOLN
30.0000 mL | INTRAMUSCULAR | Status: DC | PRN
  Administered 2013-01-09: 30 mL via SUBCUTANEOUS
  Filled 2013-01-09: qty 30

## 2013-01-09 MED ORDER — SIMETHICONE 80 MG PO CHEW: 80.0000 mg | CHEWABLE_TABLET | ORAL | Status: DC | PRN

## 2013-01-09 MED ORDER — ZOLPIDEM TARTRATE 5 MG PO TABS
5.0000 mg | ORAL_TABLET | Freq: Every evening | ORAL | Status: DC | PRN
  Administered 2013-01-09 – 2013-01-10 (×2): 5 mg via ORAL
  Filled 2013-01-09 (×3): qty 1

## 2013-01-09 MED ORDER — OXYCODONE-ACETAMINOPHEN 5-325 MG PO TABS
1.0000 | ORAL_TABLET | ORAL | Status: DC | PRN
  Administered 2013-01-09: 2 via ORAL
  Administered 2013-01-09: 1 via ORAL
  Administered 2013-01-10: 2 via ORAL
  Administered 2013-01-10: 1 via ORAL
  Filled 2013-01-09: qty 1
  Filled 2013-01-09 (×2): qty 2

## 2013-01-09 MED ORDER — SENNOSIDES-DOCUSATE SODIUM 8.6-50 MG PO TABS
2.0000 | ORAL_TABLET | Freq: Every day | ORAL | Status: DC
  Administered 2013-01-09 – 2013-01-10 (×2): 2 via ORAL

## 2013-01-09 MED ORDER — BENZOCAINE-MENTHOL 20-0.5 % EX AERO
1.0000 "application " | INHALATION_SPRAY | CUTANEOUS | Status: DC | PRN
  Filled 2013-01-09: qty 56

## 2013-01-09 MED ORDER — PENICILLIN G POTASSIUM 5000000 UNITS IJ SOLR
2.5000 10*6.[IU] | INTRAMUSCULAR | Status: DC
  Filled 2013-01-09 (×3): qty 2.5

## 2013-01-09 MED ORDER — DIBUCAINE 1 % RE OINT
1.0000 "application " | TOPICAL_OINTMENT | RECTAL | Status: DC | PRN
  Filled 2013-01-09: qty 28

## 2013-01-09 MED ORDER — OXYCODONE-ACETAMINOPHEN 5-325 MG PO TABS
1.0000 | ORAL_TABLET | ORAL | Status: DC | PRN
  Administered 2013-01-09: 1 via ORAL
  Filled 2013-01-09 (×2): qty 1

## 2013-01-09 MED ORDER — IBUPROFEN 600 MG PO TABS
600.0000 mg | ORAL_TABLET | Freq: Four times a day (QID) | ORAL | Status: DC
  Administered 2013-01-09 – 2013-01-11 (×8): 600 mg via ORAL
  Filled 2013-01-09 (×9): qty 1

## 2013-01-09 MED ORDER — TETANUS-DIPHTH-ACELL PERTUSSIS 5-2.5-18.5 LF-MCG/0.5 IM SUSP: 0.5000 mL | Freq: Once | INTRAMUSCULAR | Status: DC

## 2013-01-09 MED ORDER — ONDANSETRON HCL 4 MG/2ML IJ SOLN: 4.0000 mg | INTRAMUSCULAR | Status: DC | PRN

## 2013-01-09 MED ORDER — OXYTOCIN BOLUS FROM INFUSION: 500.0000 mL | INTRAVENOUS | Status: DC

## 2013-01-09 NOTE — MAU Note (Signed)
Pt reports leaking clear/pinkish fluid since 0200.

## 2013-01-09 NOTE — H&P (Signed)
32 year old G 4 P 0111 at 61 w 2 days presents with PPROM this am at 2 am. History of 35 week delivery. On admission, she was 1 cm dilated and not contracting. Over the past hour she progressed very quickly to 6 cm and then complete and I arrived shortly before delivery. She did receive 1 dose of Ampicillin.  Afebrile vss Fetal heart rate Category 1 Gen alert and oriented Lung CTAB Car RRR Abd is soft and non tender  Cervix C/C +2  IMPRESSION: IUP at 34 w 2 days  PTL  PLAN: NSVD NICU at delivery

## 2013-01-09 NOTE — MAU Note (Signed)
Dr. Adalberto Ill notified of pt, admission orders rec'd.

## 2013-01-10 LAB — CBC
MCH: 30.8 pg (ref 26.0–34.0)
Platelets: 155 10*3/uL (ref 150–400)
RBC: 3.64 MIL/uL — ABNORMAL LOW (ref 3.87–5.11)
RDW: 13.2 % (ref 11.5–15.5)

## 2013-01-10 NOTE — Plan of Care (Signed)
Problem: Phase II Progression Outcomes Goal: Progress activity as tolerated unless otherwise ordered Outcome: Completed/Met Date Met:  01/10/13 Patient has been walking back and fort to NICU and tolerating it well. Goal: Tolerating diet Outcome: Completed/Met Date Met:  01/10/13 Tolerates a Regular diet well  Problem: Discharge Progression Outcomes Goal: Pain controlled with appropriate interventions Outcome: Completed/Met Date Met:  01/10/13 Good pain control with po Motrin and Percocet.

## 2013-01-10 NOTE — Clinical Social Work Note (Signed)
Clinical Social Work Department PSYCHOSOCIAL ASSESSMENT - MATERNAL/CHILD 01/10/2013  Patient:  Kaylee Ellis, Kaylee Ellis  Account Number:  [id number]  Admit Date:  01/09/2013  Marjo Bicker Name:    Clinical Social Worker:  Truman Hayward, LCSW   Date/Time:  01/10/2013 01:00 PM  Date Referred:  01/10/2013   Referral source  Physician  RN     Referred reason  NICU   Other referral source:    I:  FAMILY / HOME ENVIRONMENT Child's legal guardian:  PARENT  Guardian - Name Guardian - Age Guardian - Address  Kaylee Ellis [address] [place], [place] [address]  Kaylee Ellis  [address] [place], [place] [address]   Other household support members/support persons Name Relationship DOB  1 other child in the home     Other support:   MOB and FOB report family out of state, however good friend support in area.    II  PSYCHOSOCIAL DATA Information Source:  Patient Interview  Event organiser Employment:   [employer]  MOB currenly unemployed   Financial resources:  Media planner If [employer] - [place]:    School / Grade:   Maternity Care Coordinator / Child Services Coordination / Early Interventions:  Cultural issues impacting care:    III  STRENGTHS Strengths  Adequate Resources  Home prepared for Child (including basic supplies)  Compliance with medical plan  Supportive family/friends  Understanding of illness   Strength comment:    IV  RISK FACTORS AND CURRENT PROBLEMS Current Problem:  None   Risk Factor & Current Problem Patient Issue Family Issue Risk Factor / Current Problem Comment   N N     V  SOCIAL WORK ASSESSMENT CSW spoke with MOB and FOB while in NICU with infant.  CSW discussed infant admission to NICU and understanding of illness.  MOB and FOB report understanding and good communication with nurses and doctors.  MOB and FOB report appropriate emotion response to NICU admission. CSW discussed PPD symptoms and MOB reported she  knew what to look out for.  MOB and FOB are married and live with one other child in the home. CSW discussed any concerns with supplies or family support.  MOB and FOB report no concerns with supplies at this time and that most family lives out of state, however they feel very supported and have lots of friends in the area.  CSW discussed continued NICU stay and SW support.  CSW instructed MOB and FOB to let CSW know if any concerns arise.  CSW will continue to follow while infant is in NICU.      VI SOCIAL WORK PLAN Social Work Plan  Psychosocial Support/Ongoing Assessment of Needs   Type of pt/family education:   PPD symptoms.   If child protective services report - county:   If child protective services report - date:   Information/referral to community resources comment:   Other social work plan:

## 2013-01-10 NOTE — Progress Notes (Signed)
Post Partum Day 1 Subjective: no complaints, up ad lib, voiding and tolerating PO  Objective: Blood pressure 95/57, pulse 57, temperature 97.7 F (36.5 C), temperature source Oral, resp. rate 15, height 5\' 6"  (1.676 m), weight 69.4 kg (153 lb), last menstrual period 10/18/2011, SpO2 97.00%, unknown if currently breastfeeding.  Physical Exam:  General: alert, cooperative and appears stated age Lochia: appropriate Uterine Fundus: firm Incision: healing well, no significant drainage, no dehiscence DVT Evaluation: No evidence of DVT seen on physical exam.   Recent Labs  01/09/13 0438 01/10/13 0530  HGB 11.8* 11.2*  HCT 34.8* 32.6*    Assessment/Plan: Plan for discharge tomorrow Baby stable in the NICU   LOS: 1 day   Kaylee Ellis 01/10/2013, 7:08 AM

## 2013-01-11 MED ORDER — IBUPROFEN 600 MG PO TABS: 600.0000 mg | ORAL_TABLET | Freq: Four times a day (QID) | ORAL | Status: DC

## 2013-01-11 NOTE — Discharge Summary (Signed)
Obstetric Discharge Summary Reason for Admission: rupture of membranes Prenatal Procedures: ultrasound Intrapartum Procedures: spontaneous vaginal delivery Postpartum Procedures: none Complications-Operative and Postpartum: none Hemoglobin  Date Value Range Status  01/10/2013 11.2* 12.0 - 15.0 g/dL Final     HCT  Date Value Range Status  01/10/2013 32.6* 36.0 - 46.0 % Final    Physical Exam:  General: alert and cooperative Lochia: appropriate Uterine Fundus: firm Incision: healing well DVT Evaluation: No evidence of DVT seen on physical exam. Negative Homan's sign. No cords or calf tenderness. No significant calf/ankle edema.  Discharge Diagnoses: PROM x6 hours  Discharge Information: Date: 01/11/2013 Activity: pelvic rest Diet: routine Medications: PNV and Ibuprofen Condition: stable Instructions: refer to practice specific booklet Discharge to: home   Newborn Data: Live born female  Birth Weight:  APGAR: 8, 8  Home with baby in NICU.  Itzy Adler G 01/11/2013, 8:31 AM

## 2013-01-11 NOTE — Progress Notes (Signed)
Ur chart review completed.  

## 2013-01-11 NOTE — Progress Notes (Signed)
Pt is discharged in the care of husband. Downstairs per ambulatory. Infant to remain in [facility]. Denies any pain, heavy vaginal bleeding. Discharge instructions with Rx were given to pt. Questions were asked and answered. Stable.

## 2014-03-14 ENCOUNTER — Other Ambulatory Visit: Payer: Self-pay | Admitting: Obstetrics and Gynecology

## 2014-03-14 DIAGNOSIS — N63 Unspecified lump in unspecified breast: Secondary | ICD-10-CM

## 2014-03-16 ENCOUNTER — Encounter (INDEPENDENT_AMBULATORY_CARE_PROVIDER_SITE_OTHER): Payer: Self-pay

## 2014-03-16 ENCOUNTER — Ambulatory Visit
Admission: RE | Admit: 2014-03-16 | Discharge: 2014-03-16 | Disposition: A | Discharge status: Home / Self Care | Payer: BC Managed Care – PPO | Source: Ambulatory Visit | Attending: Obstetrics and Gynecology | Admitting: Obstetrics and Gynecology

## 2014-03-16 DIAGNOSIS — N63 Unspecified lump in unspecified breast: Secondary | ICD-10-CM

## 2014-05-19 LAB — OB RESULTS CONSOLE GC/CHLAMYDIA
Chlamydia: NEGATIVE
GC PROBE AMP, GENITAL: NEGATIVE

## 2014-05-19 LAB — OB RESULTS CONSOLE RPR: RPR: NONREACTIVE

## 2014-05-19 LAB — OB RESULTS CONSOLE ABO/RH: RH Type: POSITIVE

## 2014-05-19 LAB — OB RESULTS CONSOLE HIV ANTIBODY (ROUTINE TESTING): HIV: NONREACTIVE

## 2014-05-27 ENCOUNTER — Inpatient Hospital Stay (HOSPITAL_COMMUNITY)
Admission: AD | Admit: 2014-05-27 | Discharge: 2014-05-28 | Disposition: A | Discharge status: Home / Self Care | Payer: BC Managed Care – PPO | Source: Ambulatory Visit | Attending: Obstetrics and Gynecology | Admitting: Obstetrics and Gynecology

## 2014-05-27 ENCOUNTER — Encounter (HOSPITAL_COMMUNITY): Payer: Self-pay | Admitting: *Deleted

## 2014-05-27 DIAGNOSIS — Z87891 Personal history of nicotine dependence: Secondary | ICD-10-CM | POA: Diagnosis not present

## 2014-05-27 DIAGNOSIS — O99891 Other specified diseases and conditions complicating pregnancy: Secondary | ICD-10-CM | POA: Diagnosis not present

## 2014-05-27 DIAGNOSIS — O99411 Diseases of the circulatory system complicating pregnancy, first trimester: Secondary | ICD-10-CM

## 2014-05-27 DIAGNOSIS — I499 Cardiac arrhythmia, unspecified: Secondary | ICD-10-CM | POA: Diagnosis not present

## 2014-05-27 DIAGNOSIS — I4460 Unspecified fascicular block: Secondary | ICD-10-CM | POA: Diagnosis not present

## 2014-05-27 DIAGNOSIS — O9989 Other specified diseases and conditions complicating pregnancy, childbirth and the puerperium: Principal | ICD-10-CM

## 2014-05-27 DIAGNOSIS — R002 Palpitations: Secondary | ICD-10-CM | POA: Insufficient documentation

## 2014-05-27 DIAGNOSIS — I447 Left bundle-branch block, unspecified: Secondary | ICD-10-CM

## 2014-05-27 LAB — CBC
HEMATOCRIT: 33.2 % — AB (ref 36.0–46.0)
HEMOGLOBIN: 11.4 g/dL — AB (ref 12.0–15.0)
MCH: 30 pg (ref 26.0–34.0)
MCHC: 34.3 g/dL (ref 30.0–36.0)
MCV: 87.4 fL (ref 78.0–100.0)
Platelets: 198 10*3/uL (ref 150–400)
RBC: 3.8 MIL/uL — ABNORMAL LOW (ref 3.87–5.11)
RDW: 12.7 % (ref 11.5–15.5)
WBC: 9.4 10*3/uL (ref 4.0–10.5)

## 2014-05-27 NOTE — MAU Note (Signed)
PT SAYS SHE HAS HAD HEART PALPITATIONS  WITH OTHER  2 PREG.    DR-  ?  CARDIOLOGISTS-  HAS HAD OPEN HEART SURGERY-  1985.     SAYS FEELS TIRED .  WAS IN OFFICE - END OF AUGUST.   NEXT APPOINTMENT  9-16  AT HOME- SHE   CHECKED HER PULSE-  40-50.    DOES NOT TAKE ANY MEDS FOR PALPITATIONS.

## 2014-05-27 NOTE — MAU Provider Note (Signed)
Chief Complaint: No chief complaint on file.   First Provider Initiated Contact with Patient 05/27/14 2308     SUBJECTIVE HPI: Kaylee Ellis is a 33 y.o. [id number] at [redacted]w[redacted]d by LMP who presents with heart palpitations, SOB and feeling heaviness in her chest since yesterday. Hx similar sensation early in a previous pregnancies. No eval/intervention. Checked pulse tonight when Sx worsened after taking a walk. Pulse in 40-50's. Hx VSD repair 1985 and ablation for SVT in 2012. No longer under care of cardiologist.   Hypothyroid on Synthroid. Taking as directed . TSH drawn in office last week. Does not know results.   Past Medical History  Diagnosis Date  . Chest pain     history of, 2010  . Acne     history of  . Hashimoto disease     history of  . VSD (ventricular septal defect)     history of  . SVT (supraventricular tachycardia)     history of  . Hypothyroidism    OB History  Gravida Para Term Preterm AB SAB TAB Ectopic Multiple Living  0   2    # Outcome Date GA Lbr Len/2nd Weight Sex Delivery Anes PTL Lv  5 CUR           4 PRE 01/09/13 [redacted]w[redacted]d 06:19 / 00:14  F SVD Local  Y  3 SAB           2 GRA              Comments: System Generated. Please review and update pregnancy details.  1 PRE      SVD        Past Surgical History  Procedure Laterality Date  . Vsd repair      closure of ventricular septal defect  . Cardiac catheterization      for SVT  . Dilation and curettage of uterus    . Knee surgery Bilateral    History   Social History  . Marital Status: Married    Spouse Name: N/A    Number of Children: N/A  . Years of Education: N/A   Occupational History  . Not on file.   Social History Main Topics  . Smoking status: Former Games developer  . Smokeless tobacco: Not on file  . Alcohol Use: Yes     Comment: occasional  NONE WHILE PREG  . Drug Use: No  . Sexual Activity: Not Currently   Other Topics Concern  . Not on file   Social History Narrative   . No narrative on file   No current facility-administered medications on file prior to encounter.   Current Outpatient Prescriptions on File Prior to Encounter  Medication Sig Dispense Refill  . levothyroxine (SYNTHROID, LEVOTHROID) 100 MCG tablet Take 100 mcg by mouth daily.        . Prenatal Vit-Fe Fumarate-FA (PRENATAL MULTIVITAMIN) TABS Take 1 tablet by mouth daily.      Marland Kitchen ibuprofen (ADVIL,MOTRIN) 600 MG tablet Take 1 tablet (600 mg total) by mouth every 6 (six) hours.  30 tablet  1   No Known Allergies  ROS: Positive for palpitations, heaviness in chest, mild shortness of breath. Negative for fever, chills, syncope, abdominal pain or vaginal bleeding.  OBJECTIVE Blood pressure 100/52, pulse 53, temperature 98.5 F (36.9 C), temperature source Oral, resp. rate 18, height  (1.676 m), weight 58.333 kg (128 lb 9.6 oz), last menstrual period 03/22/2014, SpO2 100.00%, unknown if currently  breastfeeding. Patient Vitals for the past 24 hrs:  BP Temp Temp src Pulse Resp SpO2 Height Weight  05/28/14 0040 - - - 53 - 100 % - -  05/28/14 0035 - - - 47 - 100 % - -  05/28/14 0030 - - - 44 - 100 % - -  05/28/14 0025 - - - 43 - 99 % - -  05/28/14 0020 - - - 43 - 99 % - -  05/28/14 0015 - - - 48 - 99 % - -  05/28/14 0010 - - - 50 - 100 % - -  05/28/14 0007 - - - 51 - - - -  05/28/14 0006 100/52 mmHg 98.5 F (36.9 C) Oral 53 18 - - -  05/28/14 0000 - - - 50 - 99 % - -  05/27/14 2355 - - - 45 - 99 % - -  05/27/14 2234 122/64 mmHg 98.7 F (37.1 C) Oral 52 20 100 % - -  05/27/14 2226 - - - - - -  (1.676 m) 58.333 kg (128 lb 9.6 oz)    GENERAL: Well-developed, well-nourished female in no acute distress. No cyanosis. Not diaphoretic. HEENT: Normocephalic HEART: Bradycardic in the 40s. Regular rhythm. 3/6 systolic murmur. RESP: normal effort. Clear to auscultation bilaterally. ABDOMEN: Soft, non-tender EXTREMITIES: Nontender, no edema NEURO: Alert and oriented SPECULUM EXAM:  Deferred Informal bedside ultrasound: Fetal heart rate 143. Positive fetal movement visualized. Crown-rump length equal to 8 weeks 5 days.  LAB RESULTS Results for orders placed during the hospital encounter of 05/27/14 (from the past 24 hour(s))  CBC     Status: Abnormal   Collection Time    05/27/14 11:21 PM      Result Value Ref Range   WBC 9.4  4.0 - 10.5 K/uL   RBC 3.80 (*) 3.87 - 5.11 MIL/uL   Hemoglobin 11.4 (*) 12.0 - 15.0 g/dL   HCT 16.1 (*) 09.6 - 04.5 %   MCV 87.4  78.0 - 100.0 fL   MCH 30.0  26.0 - 34.0 pg   MCHC 34.3  30.0 - 36.0 g/dL   RDW 40.9  81.1 - 91.4 %   Platelets 198  150 - 400 K/uL  COMPREHENSIVE METABOLIC PANEL     Status: Abnormal   Collection Time    05/27/14 11:21 PM      Result Value Ref Range   Sodium 137  137 - 147 mEq/L   Potassium 3.6 (*) 3.7 - 5.3 mEq/L   Chloride 100  96 - 112 mEq/L   CO2 25  19 - 32 mEq/L   Glucose, Bld 90  70 - 99 mg/dL   BUN 15  6 - 23 mg/dL   Creatinine, Ser 7.82  0.50 - 1.10 mg/dL   Calcium 95.6  8.4 - 21.3 mg/dL   Total Protein 6.7  6.0 - 8.3 g/dL   Albumin 4.0  3.5 - 5.2 g/dL   AST 13  0 - 37 U/L   ALT 11  0 - 35 U/L   Alkaline Phosphatase 57  39 - 117 U/L   Total Bilirubin 0.2 (*) 0.3 - 1.2 mg/dL   GFR calc non Af Amer >90  >90 mL/min   GFR calc Af Amer >90  >90 mL/min   Anion gap 12  5 - 15    IMAGING No results found.  MAU COURSE Discussed EKG w/ Dr. Terressa Koyanagi, Cardiologist. Conduction delay/incomplete left bundle branch block. No change from previous EKG except slightly  more bradycardic. No intervention needed. Needs to reestablish care w/ cardiologist and may need Holter monitor.   Pt feeling better. Reassured.    ASSESSMENT 1. Incomplete left bundle branch block   2. Maternal arrhythmia affecting pregnancy in first trimester, antepartum     PLAN Discharge home in stable condition per consult with Dr. Rana Snare.      Follow-up Information   Follow up with LOWE,DAVID C, MD. (As scheduled)    Specialty:   Obstetrics and Gynecology   Contact information:   [address] [place], [address] [place] [address] [phone]       Schedule an appointment as soon as possible for a visit with [facility] Main [facility]).   Specialty:  Cardiology   Contact information:   [address] [place] [address] [phone]      Follow up with [facility] ED. (for cardiac emergencies)    Contact information:   [address] [place] [address]        Medication List         ibuprofen 600 MG tablet  Commonly known as:  ADVIL,MOTRIN  Take 1 tablet (600 mg total) by mouth every 6 (six) hours.     levothyroxine 100 MCG tablet  Commonly known as:  SYNTHROID, LEVOTHROID  Take 100 mcg by mouth daily.     prenatal multivitamin Tabs tablet  Take 1 tablet by mouth daily.         [place], CNM 05/28/2014  1:01 AM

## 2014-05-28 DIAGNOSIS — I4460 Unspecified fascicular block: Secondary | ICD-10-CM

## 2014-05-28 LAB — COMPREHENSIVE METABOLIC PANEL
ALT: 11 U/L (ref 0–35)
ANION GAP: 12 (ref 5–15)
AST: 13 U/L (ref 0–37)
Albumin: 4 g/dL (ref 3.5–5.2)
Alkaline Phosphatase: 57 U/L (ref 39–117)
BUN: 15 mg/dL (ref 6–23)
CALCIUM: 10.1 mg/dL (ref 8.4–10.5)
CO2: 25 mEq/L (ref 19–32)
CREATININE: 0.53 mg/dL (ref 0.50–1.10)
Chloride: 100 mEq/L (ref 96–112)
GFR calc non Af Amer: >90 mL/min (ref >90)
GLUCOSE: 90 mg/dL (ref 70–99)
Potassium: 3.6 mEq/L — ABNORMAL LOW (ref 3.7–5.3)
Sodium: 137 mEq/L (ref 137–147)
TOTAL PROTEIN: 6.7 g/dL (ref 6.0–8.3)
Total Bilirubin: 0.2 mg/dL — ABNORMAL LOW (ref 0.3–1.2)

## 2014-05-28 NOTE — Discharge Instructions (Signed)
Bradycardia °Bradycardia is a term for a heart rate (pulse) that, in adults, is slower than 60 beats per minute. A normal rate is 60 to 100 beats per minute. A heart rate below 60 beats per minute may be normal for some adults with healthy hearts. If the rate is too slow, the heart may have trouble pumping the volume of blood the body needs. If the heart rate gets too low, blood flow to the brain may be decreased and may make you feel lightheaded, dizzy, or faint. °The heart has a natural pacemaker in the top of the heart called the SA node (sinoatrial or sinus node). This pacemaker sends out regular electrical signals to the muscle of the heart, telling the heart muscle when to beat (contract). The electrical signal travels from the upper parts of the heart (atria) through the AV node (atrioventricular node), to the lower chambers of the heart (ventricles). The ventricles squeeze, pumping the blood from your heart to your lungs and to the rest of your body. °CAUSES  °· Problem with the heart's electrical system. °· Problem with the heart's natural pacemaker. °· Heart disease, damage, or infection. °· Medications. °· Problems with minerals and salts (electrolytes). °SYMPTOMS  °· Fainting (syncope). °· Fatigue and weakness. °· Shortness of breath (dyspnea). °· Chest pain (angina). °· Drowsiness. °· Confusion. °DIAGNOSIS  °· An electrocardiogram (ECG) can help your caregiver determine the type of slow heart rate you have. °· If the cause is not seen on an ECG, you may need to wear a heart monitor that records your heart rhythm for several hours or days. °· Blood tests. °TREATMENT  °· Electrolyte supplements. °· Medications. °· Withholding medication which is causing a slow heart rate. °· Pacemaker placement. °SEEK IMMEDIATE MEDICAL CARE IF:  °· You feel lightheaded or faint. °· You develop an irregular heart rate. °· You feel chest pain or have trouble breathing. °MAKE SURE YOU:  °· Understand these  instructions. °· Will watch your condition. °· Will get help right away if you are not doing well or get worse. °Document Released: 05/25/2002 Document Revised: 11/25/2011 Document Reviewed: 12/08/2013 °ExitCare® Patient Information ©2015 [employer], LLC. This information is not intended to replace advice given to you by your health care provider. Make sure you discuss any questions you have with your health care provider. ° °

## 2014-05-28 NOTE — Progress Notes (Signed)
Written and verbal d/c instructions given and understanding voiced. 

## 2014-07-05 ENCOUNTER — Other Ambulatory Visit (HOSPITAL_COMMUNITY): Payer: Self-pay | Admitting: Obstetrics and Gynecology

## 2014-07-05 DIAGNOSIS — O99282 Endocrine, nutritional and metabolic diseases complicating pregnancy, second trimester: Secondary | ICD-10-CM

## 2014-07-05 DIAGNOSIS — E039 Hypothyroidism, unspecified: Secondary | ICD-10-CM

## 2014-07-05 DIAGNOSIS — O09212 Supervision of pregnancy with history of pre-term labor, second trimester: Secondary | ICD-10-CM

## 2014-07-05 DIAGNOSIS — O09892 Supervision of other high risk pregnancies, second trimester: Secondary | ICD-10-CM

## 2014-07-05 DIAGNOSIS — Z8774 Personal history of (corrected) congenital malformations of heart and circulatory system: Secondary | ICD-10-CM

## 2014-07-05 DIAGNOSIS — Z3689 Encounter for other specified antenatal screening: Secondary | ICD-10-CM

## 2014-07-17 ENCOUNTER — Encounter (HOSPITAL_COMMUNITY): Payer: Self-pay | Admitting: *Deleted

## 2014-07-17 ENCOUNTER — Inpatient Hospital Stay (HOSPITAL_COMMUNITY)
Admission: AD | Admit: 2014-07-17 | Discharge: 2014-07-17 | Disposition: A | Discharge status: Home / Self Care | Payer: BC Managed Care – PPO | Source: Ambulatory Visit | Attending: Obstetrics and Gynecology | Admitting: Obstetrics and Gynecology

## 2014-07-17 DIAGNOSIS — Z331 Pregnant state, incidental: Secondary | ICD-10-CM

## 2014-07-17 DIAGNOSIS — W109XXA Fall (on) (from) unspecified stairs and steps, initial encounter: Secondary | ICD-10-CM | POA: Diagnosis present

## 2014-07-17 DIAGNOSIS — O9989 Other specified diseases and conditions complicating pregnancy, childbirth and the puerperium: Secondary | ICD-10-CM | POA: Insufficient documentation

## 2014-07-17 DIAGNOSIS — Z3A17 17 weeks gestation of pregnancy: Secondary | ICD-10-CM | POA: Insufficient documentation

## 2014-07-17 DIAGNOSIS — Y92009 Unspecified place in unspecified non-institutional (private) residence as the place of occurrence of the external cause: Secondary | ICD-10-CM | POA: Insufficient documentation

## 2014-07-17 DIAGNOSIS — W108XXA Fall (on) (from) other stairs and steps, initial encounter: Secondary | ICD-10-CM | POA: Diagnosis not present

## 2014-07-17 DIAGNOSIS — Z043 Encounter for examination and observation following other accident: Secondary | ICD-10-CM

## 2014-07-17 NOTE — Discharge Instructions (Signed)
What Do I Need to Know About Injuries During Pregnancy? Injuries can happen during pregnancy. Minor falls and accidents usually do not harm you or your baby. However, any injury should be reported to your doctor. WHAT CAN I DO TO PROTECT MYSELF FROM INJURIES?  Remove rugs and loose objects on the floor.  Wear comfortable shoes that have a good grip. Do not wear high-heeled shoes.  Always wear your seat belt. The lap belt should be below your belly. Always practice safe driving.  Do not ride on a motorcycle.  Do not participate in high-impact activities or sports.  Avoid:  Walking on wet or slippery floors.  Fires.  Starting fires.  Lifting heavy pots of boiling or hot liquids.  Fixing electrical problems.  Only take medicine as told by your doctor.  Know your blood type and the blood type of the baby's father.  Call your local emergency services (911 in the [place] if you are a victim of domestic violence or assault. For help and support, contact the [employer] Domestic Violence Hotline. WHEN SHOULD I GET HELP RIGHT AWAY?  You fall on your belly or have any high-impact accident or injury.  You have been a victim of domestic violence or any kind of violence.  You have been in a car accident.  You have bleeding from your vagina.  Fluid is leaking from your vagina.  You start to have belly cramping (contractions) or pain.  You feel weak or pass out (faint).  You start to throw up (vomit) after an injury.  You have been burned.  You have a stiff neck or neck pain.  You get a headache or have vision problems after an injury.  You do not feel the baby move or the baby is not moving as much as normal. Document Released: 10/05/2010 Document Revised: 01/17/2014 Document Reviewed: 06/09/2013 [facility] Patient Information 2015 [place], [place]. This information is not intended to replace advice given to you by your health care provider. Make sure you discuss any questions you  have with your health care provider.  Fall Prevention and Home Safety Falls cause injuries and can affect all age groups. It is possible to use preventive measures to significantly decrease the likelihood of falls. There are many simple measures which can make your home safer and prevent falls. OUTDOORS  Repair cracks and edges of walkways and driveways.  Remove high doorway thresholds.  Trim shrubbery on the main path into your home.  Have good outside lighting.  Clear walkways of tools, rocks, debris, and clutter.  Check that handrails are not broken and are securely fastened. Both sides of steps should have handrails.  Have leaves, snow, and ice cleared regularly.  Use sand or salt on walkways during winter months.  In the garage, clean up grease or oil spills. BATHROOM  Install night lights.  Install grab bars by the toilet and in the tub and shower.  Use non-skid mats or decals in the tub or shower.  Place a plastic non-slip stool in the shower to sit on, if needed.  Keep floors dry and clean up all water on the floor immediately.  Remove soap buildup in the tub or shower on a regular basis.  Secure bath mats with non-slip, double-sided rug tape.  Remove throw rugs and tripping hazards from the floors. BEDROOMS  Install night lights.  Make sure a bedside light is easy to reach.  Do not use oversized bedding.  Keep a telephone by your bedside.  Have  a firm chair with side arms to use for getting dressed.  Remove throw rugs and tripping hazards from the floor. KITCHEN  Keep handles on pots and pans turned toward the center of the stove. Use back burners when possible.  Clean up spills quickly and allow time for drying.  Avoid walking on wet floors.  Avoid hot utensils and knives.  Position shelves so they are not too high or low.  Place commonly used objects within easy reach.  If necessary, use a sturdy step stool with a grab bar when  reaching.  Keep electrical cables out of the way.  Do not use floor polish or wax that makes floors slippery. If you must use wax, use non-skid floor wax.  Remove throw rugs and tripping hazards from the floor. STAIRWAYS  Never leave objects on stairs.  Place handrails on both sides of stairways and use them. Fix any loose handrails. Make sure handrails on both sides of the stairways are as long as the stairs.  Check carpeting to make sure it is firmly attached along stairs. Make repairs to worn or loose carpet promptly.  Avoid placing throw rugs at the top or bottom of stairways, or properly secure the rug with carpet tape to prevent slippage. Get rid of throw rugs, if possible.  Have an electrician put in a light switch at the top and bottom of the stairs. OTHER FALL PREVENTION TIPS  Wear low-heel or rubber-soled shoes that are supportive and fit well. Wear closed toe shoes.  When using a stepladder, make sure it is fully opened and both spreaders are firmly locked. Do not climb a closed stepladder.  Add color or contrast paint or tape to grab bars and handrails in your home. Place contrasting color strips on first and last steps.  Learn and use mobility aids as needed. Install an electrical emergency response system.  Turn on lights to avoid dark areas. Replace light bulbs that burn out immediately. Get light switches that glow.  Arrange furniture to create clear pathways. Keep furniture in the same place.  Firmly attach carpet with non-skid or double-sided tape.  Eliminate uneven floor surfaces.  Select a carpet pattern that does not visually hide the edge of steps.  Be aware of all pets. OTHER HOME SAFETY TIPS  Set the water temperature for 120 F (48.8 C).  Keep emergency numbers on or near the telephone.  Keep smoke detectors on every level of the home and near sleeping areas. Document Released: 08/23/2002 Document Revised: 03/03/2012 Document Reviewed:  11/22/2011 [facility]ExitCare Patient Information 2015 [place]ExitCare, [place]LL[place]. This information is not intended to replace advice given to you by your health care provider. Make sure you discuss any questions you have with your health care provider.

## 2014-07-17 NOTE — MAU Note (Signed)
About 0530 fell down about 3 steps. Was walking down steps and daughter in front of me leaned over to pick up something. She started to fall and I reached for her and we both fell. I fell forward about 3 steps and think I hit the top of my stomach on the steps. Denies bleeding or LOF since the fall

## 2014-07-17 NOTE — MAU Provider Note (Signed)
History     CSN: [id number]  Arrival date and time: 07/17/14 0607   First Provider Initiated Contact with Patient 07/17/14 [phone]      Chief Complaint  Patient presents with  . Fall   HPI This is a 33 y.o. female at 2871w0d who presents with c/o fall down several steps this morning. May have hit upper abdomen on stair. Caught self on hands. Denies pain in abdomen, hands or arms Denies bleeding.   RN Note:  Expand All Collapse All   About 0530 fell down about 3 steps. Was walking down steps and daughter in front of me leaned over to pick up something. She started to fall and I reached for her and we both fell. I fell forward about 3 steps and think I hit the top of my stomach on the steps. Denies bleeding or LOF since the fall        OB History    Gravida Para Term Preterm AB TAB SAB Ectopic Multiple Living   5 2  2 1  0 1   2      Past Medical History  Diagnosis Date  . Chest pain     history of, 2010  . Acne     history of  . Hashimoto disease     history of  . VSD (ventricular septal defect)     history of  . SVT (supraventricular tachycardia)     history of  . Hypothyroidism     Past Surgical History  Procedure Laterality Date  . Vsd repair      closure of ventricular septal defect  . Cardiac catheterization      for SVT  . Dilation and curettage of uterus    . Knee surgery Bilateral     Family History  Problem Relation Age of Onset  . Cancer Paternal Uncle     History  Substance Use Topics  . Smoking status: Former Games developermoker  . Smokeless tobacco: Not on file  . Alcohol Use: Yes     Comment: occasional  NONE WHILE PREG    Allergies: No Known Allergies  Prescriptions prior to admission  Medication Sig Dispense Refill Last Dose  . levothyroxine (SYNTHROID, LEVOTHROID) 100 MCG tablet Take 100 mcg by mouth daily.     07/17/2014 at Unknown time  . Prenatal Vit-Fe Fumarate-FA (PRENATAL MULTIVITAMIN) TABS Take 1 tablet by mouth daily.   07/16/2014 at  Unknown time  . ibuprofen (ADVIL,MOTRIN) 600 MG tablet Take 1 tablet (600 mg total) by mouth every 6 (six) hours. 30 tablet 1 More than a month at Unknown time    Review of Systems  Constitutional: Negative for fever, chills and malaise/fatigue.  Gastrointestinal: Negative for nausea, vomiting and abdominal pain.  Musculoskeletal: Positive for falls. Negative for back pain, joint pain and neck pain.  Neurological: Negative for dizziness, focal weakness, weakness and headaches.   Physical Exam   Blood pressure 95/63, pulse 63, temperature 98.6 F (37 C), resp. rate 20, height 5\' 6"  (1.676 m), weight 132 lb 12.8 oz (60.238 kg), last menstrual period 03/22/2014, unknown if currently breastfeeding.  Physical Exam  Constitutional: She is oriented to person, place, and time. She appears well-developed and well-nourished. No distress.  HENT:  Head: Normocephalic.  Cardiovascular: Normal rate and regular rhythm.   Respiratory: Effort normal.  GI: Soft. She exhibits no distension and no mass. There is no tenderness. There is no rebound and no guarding.  Informal bedside US shows active fetus with HR  in the 150s, adequate amniotic fluid. Posterior placenta  Genitourinary: No vaginal discharge found.  Musculoskeletal: Normal range of motion.  Neurological: She is alert and oriented to person, place, and time.  Skin: Skin is warm and dry.  Psychiatric: She has a normal mood and affect.    MAU Course  Procedures  MDM  Assessment and Plan  A:  SIUP at 4871w0d       Fall down stairs      No apparent injury to mother or fetus  P:  Discussed with Dr Henderson Cloudomblin       Discharge home       Followup in office  [facility] CityWILLIAMS,Kaylee Ellis 07/17/2014, 7:33 AM

## 2014-07-17 NOTE — Progress Notes (Signed)
Marie Williams CNM in to discuss d/c plan with pt. Written and verbal d/c instructions given and understanding voiced. 

## 2014-07-18 ENCOUNTER — Encounter (HOSPITAL_COMMUNITY): Payer: Self-pay | Admitting: *Deleted

## 2014-07-28 ENCOUNTER — Encounter (HOSPITAL_COMMUNITY): Payer: Self-pay

## 2014-07-28 ENCOUNTER — Ambulatory Visit (HOSPITAL_COMMUNITY)
Admission: RE | Admit: 2014-07-28 | Discharge: 2014-07-28 | Disposition: A | Discharge status: Home / Self Care | Payer: BC Managed Care – PPO | Source: Ambulatory Visit | Attending: Obstetrics and Gynecology | Admitting: Obstetrics and Gynecology

## 2014-07-28 ENCOUNTER — Other Ambulatory Visit (HOSPITAL_COMMUNITY): Payer: Self-pay

## 2014-07-28 DIAGNOSIS — E039 Hypothyroidism, unspecified: Secondary | ICD-10-CM | POA: Diagnosis not present

## 2014-07-28 DIAGNOSIS — Z3A17 17 weeks gestation of pregnancy: Secondary | ICD-10-CM | POA: Insufficient documentation

## 2014-07-28 DIAGNOSIS — O09212 Supervision of pregnancy with history of pre-term labor, second trimester: Secondary | ICD-10-CM | POA: Diagnosis not present

## 2014-07-28 DIAGNOSIS — O352XX0 Maternal care for (suspected) hereditary disease in fetus, not applicable or unspecified: Secondary | ICD-10-CM | POA: Insufficient documentation

## 2014-07-28 DIAGNOSIS — Z8774 Personal history of (corrected) congenital malformations of heart and circulatory system: Secondary | ICD-10-CM | POA: Insufficient documentation

## 2014-07-28 DIAGNOSIS — Z36 Encounter for antenatal screening of mother: Secondary | ICD-10-CM | POA: Insufficient documentation

## 2014-07-28 DIAGNOSIS — O99282 Endocrine, nutritional and metabolic diseases complicating pregnancy, second trimester: Secondary | ICD-10-CM | POA: Insufficient documentation

## 2014-07-28 DIAGNOSIS — Z3689 Encounter for other specified antenatal screening: Secondary | ICD-10-CM | POA: Insufficient documentation

## 2014-07-28 DIAGNOSIS — Z8279 Family history of other congenital malformations, deformations and chromosomal abnormalities: Secondary | ICD-10-CM | POA: Insufficient documentation

## 2014-07-28 DIAGNOSIS — O09892 Supervision of other high risk pregnancies, second trimester: Secondary | ICD-10-CM | POA: Insufficient documentation

## 2014-09-16 NOTE — L&D Delivery Note (Signed)
  Kaylee Ellis, PendingBaby [[id number]]  Delivery Note>>>>    precip del by RN, pt arrived @ MAU C/C/ +3, place spont intact   At  a viable unspecified sex was delivered via  (Presentation:OA ;  ).  APGAR: , ; weight  .   Placenta status: , .  Cord:  with the following complications: .  Cord pH: not sent Anesthesia:  loc Episiotomy:  none Lacerations:  Sec deg Suture Repair: 3.0 vicryl rapide Est. Blood Loss (mL): 350  Mom to postpartum.  Baby to Couplet care / Skin to Skin.  Meriel PicaHOLLAND,[name] M 12/16/2014, 10:59 AM     Kaylee Ellis, Kaylee Ellis [[id number]]

## 2014-12-07 ENCOUNTER — Inpatient Hospital Stay (HOSPITAL_COMMUNITY)
Admission: AD | Admit: 2014-12-07 | Discharge: 2014-12-07 | Disposition: A | Discharge status: Home / Self Care | Payer: BLUE CROSS/BLUE SHIELD | Source: Ambulatory Visit | Attending: Obstetrics and Gynecology | Admitting: Obstetrics and Gynecology

## 2014-12-07 ENCOUNTER — Encounter (HOSPITAL_COMMUNITY): Payer: Self-pay | Admitting: *Deleted

## 2014-12-07 DIAGNOSIS — Z3A37 37 weeks gestation of pregnancy: Secondary | ICD-10-CM | POA: Diagnosis not present

## 2014-12-07 NOTE — MAU Note (Signed)
Having more frequent contractions. No bleeding or leaking. Was 3 on Mon.

## 2014-12-07 NOTE — Discharge Instructions (Signed)

## 2014-12-07 NOTE — MAU Note (Signed)
C/o ucs since 1330;

## 2014-12-15 ENCOUNTER — Inpatient Hospital Stay (HOSPITAL_COMMUNITY)
Admission: AD | Admit: 2014-12-15 | Discharge: 2014-12-16 | DRG: 780 | Disposition: A | Discharge status: Home / Self Care | Payer: BLUE CROSS/BLUE SHIELD | Source: Ambulatory Visit | Attending: Obstetrics & Gynecology | Admitting: Obstetrics & Gynecology

## 2014-12-15 ENCOUNTER — Encounter (HOSPITAL_COMMUNITY): Payer: Self-pay | Admitting: *Deleted

## 2014-12-15 DIAGNOSIS — Z87891 Personal history of nicotine dependence: Secondary | ICD-10-CM

## 2014-12-15 DIAGNOSIS — O99284 Endocrine, nutritional and metabolic diseases complicating childbirth: Secondary | ICD-10-CM | POA: Diagnosis present

## 2014-12-15 DIAGNOSIS — Z3A38 38 weeks gestation of pregnancy: Secondary | ICD-10-CM | POA: Diagnosis present

## 2014-12-15 DIAGNOSIS — IMO0001 Reserved for inherently not codable concepts without codable children: Secondary | ICD-10-CM

## 2014-12-15 DIAGNOSIS — E039 Hypothyroidism, unspecified: Secondary | ICD-10-CM | POA: Diagnosis present

## 2014-12-15 DIAGNOSIS — O471 False labor at or after 37 completed weeks of gestation: Principal | ICD-10-CM | POA: Diagnosis present

## 2014-12-15 LAB — OB RESULTS CONSOLE GBS: GBS: NEGATIVE

## 2014-12-15 NOTE — MAU Note (Signed)
Contractions. Denies LOF or vaginal bleeding. Positive fetal movement. Was dilated 4 cm in office.

## 2014-12-15 NOTE — MAU Note (Signed)
Pt started having contractons around 2000. Pt states she was in the Dr."s office  Wednesday morning.Pt states her contractions are q5 min

## 2014-12-16 ENCOUNTER — Inpatient Hospital Stay (HOSPITAL_COMMUNITY)
Admission: AD | Admit: 2014-12-16 | Discharge: 2014-12-18 | DRG: 775 | Disposition: A | Discharge status: Home / Self Care | Payer: BLUE CROSS/BLUE SHIELD | Source: Ambulatory Visit | Attending: Obstetrics and Gynecology | Admitting: Obstetrics and Gynecology

## 2014-12-16 ENCOUNTER — Encounter (HOSPITAL_COMMUNITY): Payer: Self-pay | Admitting: Obstetrics

## 2014-12-16 ENCOUNTER — Encounter (HOSPITAL_COMMUNITY): Payer: Self-pay | Admitting: *Deleted

## 2014-12-16 DIAGNOSIS — O99284 Endocrine, nutritional and metabolic diseases complicating childbirth: Secondary | ICD-10-CM | POA: Diagnosis present

## 2014-12-16 DIAGNOSIS — Z3A38 38 weeks gestation of pregnancy: Secondary | ICD-10-CM | POA: Diagnosis present

## 2014-12-16 DIAGNOSIS — Z87891 Personal history of nicotine dependence: Secondary | ICD-10-CM | POA: Diagnosis not present

## 2014-12-16 DIAGNOSIS — E063 Autoimmune thyroiditis: Secondary | ICD-10-CM | POA: Diagnosis present

## 2014-12-16 DIAGNOSIS — O471 False labor at or after 37 completed weeks of gestation: Secondary | ICD-10-CM | POA: Diagnosis present

## 2014-12-16 DIAGNOSIS — IMO0001 Reserved for inherently not codable concepts without codable children: Secondary | ICD-10-CM

## 2014-12-16 DIAGNOSIS — E039 Hypothyroidism, unspecified: Secondary | ICD-10-CM | POA: Diagnosis present

## 2014-12-16 DIAGNOSIS — Z3490 Encounter for supervision of normal pregnancy, unspecified, unspecified trimester: Secondary | ICD-10-CM

## 2014-12-16 LAB — CBC
HEMATOCRIT: 33.9 % — AB (ref 36.0–46.0)
Hemoglobin: 11.7 g/dL — ABNORMAL LOW (ref 12.0–15.0)
MCH: 30.8 pg (ref 26.0–34.0)
MCHC: 34.5 g/dL (ref 30.0–36.0)
MCV: 89.2 fL (ref 78.0–100.0)
PLATELETS: 140 10*3/uL — AB (ref 150–400)
RBC: 3.8 MIL/uL — AB (ref 3.87–5.11)
RDW: 13.1 % (ref 11.5–15.5)
WBC: 10.6 10*3/uL — AB (ref 4.0–10.5)

## 2014-12-16 LAB — TYPE AND SCREEN
ABO/RH(D): O POS
Antibody Screen: NEGATIVE

## 2014-12-16 LAB — RPR: RPR Ser Ql: NONREACTIVE

## 2014-12-16 MED ORDER — OXYCODONE-ACETAMINOPHEN 5-325 MG PO TABS: 1.0000 | ORAL_TABLET | ORAL | Status: DC | PRN

## 2014-12-16 MED ORDER — ONDANSETRON HCL 4 MG PO TABS: 4.0000 mg | ORAL_TABLET | ORAL | Status: DC | PRN

## 2014-12-16 MED ORDER — OXYTOCIN 10 UNIT/ML IJ SOLN
10.0000 [IU] | Freq: Once | INTRAMUSCULAR | Status: AC
  Administered 2014-12-16: 10 [IU] via INTRAMUSCULAR

## 2014-12-16 MED ORDER — LACTATED RINGERS IV SOLN
INTRAVENOUS | Status: DC
  Administered 2014-12-16: 01:00:00 via INTRAVENOUS

## 2014-12-16 MED ORDER — LEVOTHYROXINE SODIUM 100 MCG PO TABS
100.0000 ug | ORAL_TABLET | Freq: Every day | ORAL | Status: DC
  Administered 2014-12-17 – 2014-12-18 (×2): 100 ug via ORAL
  Filled 2014-12-16 (×2): qty 1

## 2014-12-16 MED ORDER — BENZOCAINE-MENTHOL 20-0.5 % EX AERO
1.0000 "application " | INHALATION_SPRAY | CUTANEOUS | Status: DC | PRN
  Administered 2014-12-16: 1 via TOPICAL
  Filled 2014-12-16: qty 56

## 2014-12-16 MED ORDER — OXYTOCIN 40 UNITS IN LACTATED RINGERS INFUSION - SIMPLE MED: 62.5000 mL/h | INTRAVENOUS | Status: DC

## 2014-12-16 MED ORDER — BISACODYL 10 MG RE SUPP: 10.0000 mg | Freq: Every day | RECTAL | Status: DC | PRN

## 2014-12-16 MED ORDER — FLEET ENEMA 7-19 GM/118ML RE ENEM: 1.0000 | ENEMA | Freq: Every day | RECTAL | Status: DC | PRN

## 2014-12-16 MED ORDER — OXYCODONE-ACETAMINOPHEN 5-325 MG PO TABS
2.0000 | ORAL_TABLET | ORAL | Status: DC | PRN
  Administered 2014-12-16: 2 via ORAL
  Filled 2014-12-16: qty 2

## 2014-12-16 MED ORDER — ONDANSETRON HCL 4 MG/2ML IJ SOLN: 4.0000 mg | Freq: Four times a day (QID) | INTRAMUSCULAR | Status: DC | PRN

## 2014-12-16 MED ORDER — OXYCODONE-ACETAMINOPHEN 5-325 MG PO TABS: 2.0000 | ORAL_TABLET | ORAL | Status: DC | PRN

## 2014-12-16 MED ORDER — LIDOCAINE HCL (PF) 1 % IJ SOLN: 30.0000 mL | INTRAMUSCULAR | Status: DC | PRN

## 2014-12-16 MED ORDER — TETANUS-DIPHTH-ACELL PERTUSSIS 5-2.5-18.5 LF-MCG/0.5 IM SUSP: 0.5000 mL | Freq: Once | INTRAMUSCULAR | Status: DC

## 2014-12-16 MED ORDER — PRENATAL MULTIVITAMIN CH
1.0000 | ORAL_TABLET | Freq: Every day | ORAL | Status: DC
  Administered 2014-12-17: 1 via ORAL
  Filled 2014-12-16: qty 1

## 2014-12-16 MED ORDER — OXYTOCIN 40 UNITS IN LACTATED RINGERS INFUSION - SIMPLE MED
1.0000 m[IU]/min | INTRAVENOUS | Status: DC
  Filled 2014-12-16: qty 1000

## 2014-12-16 MED ORDER — IBUPROFEN 800 MG PO TABS
800.0000 mg | ORAL_TABLET | Freq: Three times a day (TID) | ORAL | Status: DC | PRN
  Administered 2014-12-16 – 2014-12-18 (×5): 800 mg via ORAL
  Filled 2014-12-16 (×5): qty 1

## 2014-12-16 MED ORDER — ONDANSETRON HCL 4 MG/2ML IJ SOLN: 4.0000 mg | INTRAMUSCULAR | Status: DC | PRN

## 2014-12-16 MED ORDER — LACTATED RINGERS IV SOLN: 500.0000 mL | INTRAVENOUS | Status: DC | PRN

## 2014-12-16 MED ORDER — MEASLES, MUMPS & RUBELLA VAC ~~LOC~~ INJ: 0.5000 mL | INJECTION | Freq: Once | SUBCUTANEOUS | Status: DC

## 2014-12-16 MED ORDER — LIDOCAINE HCL (PF) 1 % IJ SOLN
INTRAMUSCULAR | Status: AC
  Administered 2014-12-16: 30 mL
  Filled 2014-12-16: qty 30

## 2014-12-16 MED ORDER — FLEET ENEMA 7-19 GM/118ML RE ENEM: 1.0000 | ENEMA | RECTAL | Status: DC | PRN

## 2014-12-16 MED ORDER — CITRIC ACID-SODIUM CITRATE 334-500 MG/5ML PO SOLN: 30.0000 mL | ORAL | Status: DC | PRN

## 2014-12-16 MED ORDER — LIDOCAINE HCL (PF) 1 % IJ SOLN
30.0000 mL | INTRAMUSCULAR | Status: DC | PRN
  Filled 2014-12-16: qty 30

## 2014-12-16 MED ORDER — SIMETHICONE 80 MG PO CHEW: 80.0000 mg | CHEWABLE_TABLET | ORAL | Status: DC | PRN

## 2014-12-16 MED ORDER — DIPHENHYDRAMINE HCL 25 MG PO CAPS: 25.0000 mg | ORAL_CAPSULE | Freq: Four times a day (QID) | ORAL | Status: DC | PRN

## 2014-12-16 MED ORDER — ZOLPIDEM TARTRATE 5 MG PO TABS: 5.0000 mg | ORAL_TABLET | Freq: Every evening | ORAL | Status: DC | PRN

## 2014-12-16 MED ORDER — LANOLIN HYDROUS EX OINT: TOPICAL_OINTMENT | CUTANEOUS | Status: DC | PRN

## 2014-12-16 MED ORDER — OXYTOCIN BOLUS FROM INFUSION: 500.0000 mL | INTRAVENOUS | Status: DC

## 2014-12-16 MED ORDER — TERBUTALINE SULFATE 1 MG/ML IJ SOLN: 0.2500 mg | Freq: Once | INTRAMUSCULAR | Status: DC | PRN

## 2014-12-16 MED ORDER — WITCH HAZEL-GLYCERIN EX PADS: 1.0000 "application " | MEDICATED_PAD | CUTANEOUS | Status: DC | PRN

## 2014-12-16 MED ORDER — ACETAMINOPHEN 325 MG PO TABS: 650.0000 mg | ORAL_TABLET | ORAL | Status: DC | PRN

## 2014-12-16 MED ORDER — OXYCODONE-ACETAMINOPHEN 5-325 MG PO TABS
1.0000 | ORAL_TABLET | ORAL | Status: DC | PRN
  Administered 2014-12-17: 1 via ORAL
  Filled 2014-12-16: qty 1

## 2014-12-16 MED ORDER — DIBUCAINE 1 % RE OINT: 1.0000 "application " | TOPICAL_OINTMENT | RECTAL | Status: DC | PRN

## 2014-12-16 MED ORDER — OXYTOCIN 40 UNITS IN LACTATED RINGERS INFUSION - SIMPLE MED
INTRAVENOUS | Status: AC
  Filled 2014-12-16: qty 1000

## 2014-12-16 MED ORDER — SENNOSIDES-DOCUSATE SODIUM 8.6-50 MG PO TABS
2.0000 | ORAL_TABLET | ORAL | Status: DC
  Administered 2014-12-16 – 2014-12-18 (×2): 2 via ORAL
  Filled 2014-12-16 (×2): qty 2

## 2014-12-16 NOTE — H&P (Addendum)
Kaylee Ellis is a 34 y.o. female presenting for labor.  CTX started at 830 pm tonight.  No LOF or VB.  Active FM.  Antepartum course is complicated by patient's h/o VSD with repair and SVT s/p cath.  MFM consult was placed and ultrasound showed normal fetal cardiac anatomy although DA was not seen.  Patient elected to not have ECHO.  H/O PTD x 2 with 17-P injections this pregnancy.  The patient has hypothyroidism; well-controlled.  GBS negative.   Maternal Medical History:  Reason for admission: Contractions.   Contractions: Onset was 3-5 hours ago.   Frequency: regular.   Perceived severity is moderate.    Fetal activity: Perceived fetal activity is normal.   Last perceived fetal movement was within the past hour.    Prenatal complications: no prenatal complications Prenatal Complications - Diabetes: none.    OB History    Gravida Para Term Preterm AB TAB SAB Ectopic Multiple Living   5 2  2 1  0 1   2     Past Medical History  Diagnosis Date  . Chest pain     history of, 2010  . Acne     history of  . Hashimoto disease     history of  . VSD (ventricular septal defect)     history of  . SVT (supraventricular tachycardia)     history of  . Hypothyroidism    Past Surgical History  Procedure Laterality Date  . Vsd repair      closure of ventricular septal defect  . Cardiac catheterization      for SVT  . Dilation and curettage of uterus    . Knee surgery Bilateral    Family History: family history includes Cancer in her paternal uncle. Social History:  reports that she has quit smoking. She does not have any smokeless tobacco history on file. She reports that she drinks alcohol. She reports that she does not use illicit drugs.   Prenatal Transfer Tool  Maternal Diabetes: No Genetic Screening: Normal Maternal Ultrasounds/Referrals: Normal Fetal Ultrasounds or other Referrals:  Referred to Materal Fetal Medicine  Maternal Substance Abuse:  No Significant  Maternal Medications:  Meds include: Syntroid Significant Maternal Lab Results:  Lab values include: Group B Strep negative Other Comments:  None  ROS  Dilation: 5 Effacement (%): 80 Station: -1 Exam by:: Belinda Bethea rn Blood pressure 107/59, pulse 91, resp. rate 18, height 5\' 6"  (1.676 m), weight 150 lb 9.6 oz (68.312 kg), last menstrual period 03/22/2014, unknown if currently breastfeeding. Maternal Exam:  Uterine Assessment: Contraction strength is moderate.  Contraction frequency is regular.   Abdomen: Patient reports no abdominal tenderness. Fundal height is c/w dates.   Estimated fetal weight is 7#.       Physical Exam  Constitutional: She is oriented to person, place, and time. She appears well-developed and well-nourished.  GI: Soft. There is no rebound and no guarding.  Neurological: She is alert and oriented to person, place, and time.  Skin: Skin is warm and dry.  Psychiatric: She has a normal mood and affect. Her behavior is normal.    Prenatal labs: ABO, Rh: O/Positive/-- (09/03 0000) Antibody:   Rubella:   RPR: Nonreactive (09/03 0000)  HBsAg:    HIV: Non-reactive (09/03 0000)  GBS: Negative (03/31 0000)   Assessment/Plan: 34yo [id number] at 8516w3d with labor -Epidural if desired -Anticipate NSVD   Jamil Castillo 12/16/2014, 12:26 AM

## 2014-12-16 NOTE — Progress Notes (Signed)
RN to call MD with exam

## 2014-12-16 NOTE — Discharge Summary (Signed)
Obstetric Discharge Summary Reason for Admission: false labor Prenatal Procedures: none Intrapartum Procedures: n/a Postpartum Procedures: n/a Complications-Operative and Postpartum: n/a HEMOGLOBIN  Date Value Ref Range Status  12/16/2014 11.7* 12.0 - 15.0 g/dL Final   HCT  Date Value Ref Range Status  12/16/2014 33.9* 36.0 - 46.0 % Final    Physical Exam:  General: alert, cooperative and appears stated age 52Lochia: n/a Uterine Fundus: soft Incision: n/a DVT Evaluation: No evidence of DVT seen on physical exam. Negative Homan's sign. No cords or calf tenderness.  Discharge Diagnoses: False labor-undelivered  Discharge Information: Date: 12/16/2014 Activity: unrestricted Diet: routine Medications: PNV Condition: stable Instructions: refer to practice specific booklet Discharge to: home   Newborn Data: Live born unspecified sex  Birth Weight:   APGAR: ,   Home with n/a.  Kaylee Ellis 12/16/2014, 6:08 AM

## 2014-12-16 NOTE — Discharge Instructions (Signed)
Call MD with signs and symptoms of labor.

## 2014-12-16 NOTE — Progress Notes (Signed)
Reviewed discharge instructions with patient. Patient had no question and was satisfied with instructions. Patient left with husband, ambulatory. I personally walked with them out to security desk.

## 2014-12-16 NOTE — Lactation Note (Signed)
This note was copied from the chart of Girl Bonna GainsKatelin Neubecker. Lactation Consultation Note  Patient Name: Girl Bonna GainsKatelin Laury ZOXWR'UToday's Date: 12/16/2014 Reason for consult: Initial assessment of this mom and baby at 7 hours pp.  This is mom's third baby and she breastfed 2 older children.  She breastfed, first baby for 5 months and last baby for 12 months.  Mom reports knowing how to hand express her milk.  Baby has had several feedings since birth and LATCH scores=8 and 10 so far.  LC encouraged frequent STS and cue feedings. Mom encouraged to feed baby 8-12 times/24 hours and with feeding cues. LC encouraged review of Baby and Me pp 9, 14 and 20-25 for STS and BF information. LC provided [employer] Resource brochure and reviewed [facility] services and list of community and web site resources.    Maternal Data Formula Feeding for Exclusion: No Has patient been taught Hand Expression?: Yes (experienced mom; states she knows how to hand express) Does the patient have breastfeeding experience prior to this delivery?: Yes  Feeding Feeding Type: Breast Fed Length of feed: 20 min  LATCH Score/Interventions Latch: Grasps breast easily, tongue down, lips flanged, rhythmical sucking.  Audible Swallowing: A few with stimulation Intervention(s): Skin to skin  Type of Nipple: Everted at rest and after stimulation  Comfort (Breast/Nipple): Soft / non-tender     Hold (Positioning): Assistance needed to correctly position infant at breast and maintain latch.  LATCH Score: 8  Lactation Tools Discussed/Used   STS, cue feedings, hand expression  Consult Status Consult Status: Follow-up Date: 12/17/14 Follow-up type: In-patient    Warrick ParisianBryant, Gar Glance [facility] 12/16/2014, 6:11 PM

## 2014-12-16 NOTE — Progress Notes (Signed)
Notified pt going to room 171, in active labor, was 5cm this am, hx rapid delivery.

## 2014-12-16 NOTE — Progress Notes (Signed)
After 3.5 hours of monitoring, the patient had no cervical change and cervix still posterior.  The patient reports feeling no more contractions.  She was given the option of augmentation with pitocin vs discharge home to await labor and she chose discharge. Fetal monitoring has been reassuring and reactive. She has an appointment in the office Monday.    Mitchel HonourMegan Chae Oommen, DO

## 2014-12-16 NOTE — H&P (Signed)
Kaylee Ellis is a 34 y.o. female presenting for labor. Maternal Medical History:  Reason for admission: Contractions.   Contractions: Onset was 3-5 hours ago.   Frequency: regular.   Perceived severity is strong.    Fetal activity: Perceived fetal activity is normal.   Last perceived fetal movement was within the past hour.      OB History    Gravida Para Term Preterm AB TAB SAB Ectopic Multiple Living   5 2  2 1  0 1   2     Past Medical History  Diagnosis Date  . Chest pain     history of, 2010  . Acne     history of  . Hashimoto disease     history of  . VSD (ventricular septal defect)     history of  . SVT (supraventricular tachycardia)     history of  . Hypothyroidism    Past Surgical History  Procedure Laterality Date  . Vsd repair      closure of ventricular septal defect  . Cardiac catheterization      for SVT  . Dilation and curettage of uterus    . Knee surgery Bilateral    Family History: family history includes Cancer in her paternal uncle. Social History:  reports that she has quit smoking. She does not have any smokeless tobacco history on file. She reports that she drinks alcohol. She reports that she does not use illicit drugs.   Prenatal Transfer Tool  Maternal Diabetes: No Genetic Screening: Normal Maternal Ultrasounds/Referrals: Normal Fetal Ultrasounds or other Referrals:  None Maternal Substance Abuse:  No Significant Maternal Medications:  None Significant Maternal Lab Results:  None Other Comments:  None  ROS  Dilation: 10 Effacement (%): 100 Station: +1 Exam by:: Ginger Morris RN Last menstrual period 03/22/2014, unknown if currently breastfeeding. Exam Physical Exam  Constitutional: She is oriented to person, place, and time. She appears well-developed and well-nourished.  HENT:  Head: Normocephalic and atraumatic.  Neck: Normal range of motion. Neck supple.  Cardiovascular: Normal rate and regular rhythm.    Respiratory: Effort normal and breath sounds normal.  GI:  Term FH FHR 146  Genitourinary:  C/C/+2  Musculoskeletal: Normal range of motion.  Neurological: She is alert and oriented to person, place, and time.    Prenatal labs: ABO, Rh: --/--/O POS (04/01 0035) Antibody: NEG (04/01 0035) Rubella:   RPR: Nonreactive (09/03 0000)  HBsAg:    HIV: Non-reactive (09/03 0000)  GBS: Negative (03/31 0000)   Assessment/Plan: Term preg/labor   Kaylee Ellis,Kaylee Ellis 12/16/2014, 10:58 AM

## 2014-12-17 LAB — CBC
HCT: 31.3 % — ABNORMAL LOW (ref 36.0–46.0)
Hemoglobin: 10.7 g/dL — ABNORMAL LOW (ref 12.0–15.0)
MCH: 30.7 pg (ref 26.0–34.0)
MCHC: 34.2 g/dL (ref 30.0–36.0)
MCV: 89.9 fL (ref 78.0–100.0)
Platelets: 135 10*3/uL — ABNORMAL LOW (ref 150–400)
RBC: 3.48 MIL/uL — ABNORMAL LOW (ref 3.87–5.11)
RDW: 13.2 % (ref 11.5–15.5)
WBC: 11.5 10*3/uL — ABNORMAL HIGH (ref 4.0–10.5)

## 2014-12-17 MED ORDER — IBUPROFEN 800 MG PO TABS: 800.0000 mg | ORAL_TABLET | Freq: Three times a day (TID) | ORAL | Status: DC | PRN

## 2014-12-17 NOTE — Discharge Summary (Signed)
Obstetric Discharge Summary Reason for Admission: onset of labor Prenatal Procedures: none Intrapartum Procedures: spontaneous vaginal delivery Postpartum Procedures: none Complications-Operative and Postpartum: none HEMOGLOBIN  Date Value Ref Range Status  12/17/2014 10.7* 12.0 - 15.0 g/dL Final   HCT  Date Value Ref Range Status  12/17/2014 31.3* 36.0 - 46.0 % Final    Physical Exam:  General: alert Lochia: appropriate Uterine Fundus: firm Incision: healing well DVT Evaluation: No evidence of DVT seen on physical exam.  Discharge Diagnoses: Term Pregnancy-delivered  Discharge Information: Date: 12/17/2014 Activity: pelvic rest Diet: routine Medications: None Condition: stable Instructions: refer to practice specific booklet Discharge to: home Follow-up Information    Follow up with Meriel PicaHOLLAND,Bradyn Vassey M, MD In 6 weeks.   Specialty:  Obstetrics and Gynecology   Contact information:   [address] [place] [address] [phone]       Newborn Data:   Monia SabalVerespie, PendingBaby [[id number]][[id number]]  Live born unspecified sex  Birth Weight:   APGAR: ,    Ellen HenriVerespie, Girl Rockne CoonsKatelin [[id number]][[id number]]  Live born female  Birth Weight: 6 lb 9.8 oz (2999 g) APGAR: 9, 9  Home with mother.  Meriel PicaHOLLAND,Tyjae Issa M 12/17/2014, 9:37 AM

## 2014-12-18 NOTE — Lactation Note (Signed)
This note was copied from the chart of Kaylee Ellis. Lactation Consultation Note  Patient Name: Kaylee Bonna GainsKatelin Cousin WUJWJ'XToday's Date: 12/18/2014 Reason for consult: Follow-up assessment Baby is 5947 hour old and has been consistent at the breast 10 -30 mins. Per mom milk is in both breast , and hearing increased swallows. Per mom has a HX of abundant milk supply . Discussed with mom engorgement prevention  And tx , if to full to start release down before latch , and prior to latch , breast massage, hand express,  Latch with breast compressions until the baby is in a consistent suck swallow pattern. Sore nipple tx , EBM to nipples , instructed on the comfort gels and hand pump, Increased flange size to #27. Referred to the baby and me booklet pages 24 -25. Baby rooting and hungry, mom latched with simple instructions to add the breast compressions with latch. Per mom latch was so much more comfort able . Baby sustained depth for 20 mins, multiply swallows. Mother informed of post-discharge support and given phone number to the lactation department, including services for phone  call assistance; out-patient appointments; and breastfeeding support group. List of other breastfeeding resources in the community  given in the handout. Encouraged mother to call for problems or concerns related to breastfeeding.  Maternal Data Has patient been taught Hand Expression?: Yes  Feeding Feeding Type: Breast Fed  LATCH Score/Interventions Latch: Grasps breast easily, tongue down, lips flanged, rhythmical sucking.  Audible Swallowing: Spontaneous and intermittent  Type of Nipple: Everted at rest and after stimulation  Comfort (Breast/Nipple): Soft / non-tender     Hold (Positioning): No assistance needed to correctly position infant at breast.  LATCH Score: 10  Lactation Tools Discussed/Used Tools: Pump;Flanges Flange Size: 27 Breast pump type: Manual WIC Program: No Pump Review: Milk  Storage Initiated by:: MAI  Date initiated:: 12/18/14   Consult Status Consult Status: Complete Date: 12/18/14 Follow-up type: In-patient    [name], Yisroel Mullendore Ann 12/18/2014, 9:50 AM

## 2014-12-18 NOTE — Discharge Summary (Signed)
Obstetric Discharge Summary Reason for Admission: onset of labor Prenatal Procedures: none Intrapartum Procedures: spontaneous vaginal delivery Postpartum Procedures: none Complications-Operative and Postpartum: none HEMOGLOBIN  Date Value Ref Range Status  12/17/2014 10.7* 12.0 - 15.0 g/dL Final   HCT  Date Value Ref Range Status  12/17/2014 31.3* 36.0 - 46.0 % Final    Physical Exam:  General: alert Lochia: appropriate Uterine Fundus: firm Incision: healing well DVT Evaluation: No evidence of DVT seen on physical exam.  Discharge Diagnoses: Term Pregnancy-delivered  Discharge Information: Date: 12/18/2014 Activity: pelvic rest Diet: routine Medications: PNV and Ibuprofen Condition: stable Instructions: refer to practice specific booklet Discharge to: home Follow-up Information    Follow up with Meriel PicaHOLLAND,Arshawn Valdez M, MD In 6 weeks.   Specialty:  Obstetrics and Gynecology   Contact information:   [address] [place] [address] [phone]       Newborn Data:   Monia SabalVerespie, PendingBaby [[id number]][[id number]]  Live born unspecified sex  Birth Weight:   APGAR: ,    Ellen HenriVerespie, Girl Rockne CoonsKatelin [[id number]][[id number]]  Live born female  Birth Weight: 6 lb 9.8 oz (2999 g) APGAR: 9, 9  Home with mother.  Meriel PicaHOLLAND,Yvaine Jankowiak M 12/18/2014, 9:27 AM

## 2015-01-30 ENCOUNTER — Other Ambulatory Visit: Payer: Self-pay | Admitting: Obstetrics and Gynecology

## 2015-02-01 LAB — CYTOLOGY - PAP

## 2016-04-19 ENCOUNTER — Other Ambulatory Visit: Payer: Self-pay | Admitting: Obstetrics and Gynecology

## 2016-04-19 DIAGNOSIS — R234 Changes in skin texture: Secondary | ICD-10-CM

## 2016-04-24 ENCOUNTER — Ambulatory Visit
Admission: RE | Admit: 2016-04-24 | Discharge: 2016-04-24 | Disposition: A | Discharge status: Home / Self Care | Payer: Managed Care, Other (non HMO) | Source: Ambulatory Visit | Attending: Obstetrics and Gynecology | Admitting: Obstetrics and Gynecology

## 2016-04-24 DIAGNOSIS — R234 Changes in skin texture: Secondary | ICD-10-CM

## 2018-05-28 ENCOUNTER — Other Ambulatory Visit: Payer: Self-pay

## 2018-05-28 ENCOUNTER — Emergency Department (HOSPITAL_COMMUNITY)
Admission: EM | Admit: 2018-05-28 | Discharge: 2018-05-28 | Disposition: A | Discharge status: Home / Self Care | Payer: BLUE CROSS/BLUE SHIELD | Attending: Emergency Medicine | Admitting: Emergency Medicine

## 2018-05-28 ENCOUNTER — Encounter (HOSPITAL_COMMUNITY): Payer: Self-pay

## 2018-05-28 ENCOUNTER — Emergency Department (HOSPITAL_COMMUNITY): Payer: BLUE CROSS/BLUE SHIELD

## 2018-05-28 DIAGNOSIS — Z79899 Other long term (current) drug therapy: Secondary | ICD-10-CM | POA: Diagnosis not present

## 2018-05-28 DIAGNOSIS — S93402A Sprain of unspecified ligament of left ankle, initial encounter: Secondary | ICD-10-CM | POA: Diagnosis not present

## 2018-05-28 DIAGNOSIS — Y999 Unspecified external cause status: Secondary | ICD-10-CM | POA: Insufficient documentation

## 2018-05-28 DIAGNOSIS — X500XXA Overexertion from strenuous movement or load, initial encounter: Secondary | ICD-10-CM | POA: Insufficient documentation

## 2018-05-28 DIAGNOSIS — Y929 Unspecified place or not applicable: Secondary | ICD-10-CM | POA: Diagnosis not present

## 2018-05-28 DIAGNOSIS — Y9302 Activity, running: Secondary | ICD-10-CM | POA: Insufficient documentation

## 2018-05-28 DIAGNOSIS — Z87891 Personal history of nicotine dependence: Secondary | ICD-10-CM | POA: Insufficient documentation

## 2018-05-28 DIAGNOSIS — E039 Hypothyroidism, unspecified: Secondary | ICD-10-CM | POA: Diagnosis not present

## 2018-05-28 DIAGNOSIS — S99812A Other specified injuries of left ankle, initial encounter: Secondary | ICD-10-CM | POA: Diagnosis present

## 2018-05-28 MED ORDER — KETOROLAC TROMETHAMINE 15 MG/ML IJ SOLN
15.0000 mg | Freq: Once | INTRAMUSCULAR | Status: AC
  Administered 2018-05-28: 15 mg via INTRAMUSCULAR
  Filled 2018-05-28: qty 1

## 2018-05-28 NOTE — Discharge Instructions (Signed)

## 2018-05-28 NOTE — ED Provider Notes (Signed)
[facility] Note   CSN: [id number] Arrival date & time: 05/28/18  1356     History   Chief Complaint Chief Complaint  Patient presents with  . Ankle Pain    HPI Kaylee Ellis is a 37 y.o. female.  HPI  37 y/o Female with history of hypothyroidism, SVT, VSD presents emergency department today complaining of left ankle pain that began suddenly earlier today while she was running.  States that she rolled her ankle and fell to the ground.  Denies any head trauma or LOC.  No other injuries.  Reports severe left-sided ankle pain that is worse with ambulation and palpation.  No numbness or weakness to the ankle.  Past Medical History:  Diagnosis Date  . Acne    history of  . Chest pain    history of, 2010  . Hashimoto disease    history of  . Hypothyroidism   . SVT (supraventricular tachycardia) (HCC)    history of  . VSD (ventricular septal defect)    history of    Patient Active Problem List   Diagnosis Date Noted  . Active labor 12/16/2014  . Pregnancy 12/16/2014  . Encounter for fetal anatomic survey   . [redacted] weeks gestation of pregnancy   . History of preterm delivery, currently pregnant in second trimester   . Hypothyroidism affecting pregnancy in second trimester, antepartum   . S/P VSD repair   . Family history of congenital heart disease   . Fall on stairs 07/17/2014  . Palpitations 12/31/2010  . S/P VSD closure 12/31/2010  . SVT (supraventricular tachycardia) (HCC) 12/31/2010    Past Surgical History:  Procedure Laterality Date  . CARDIAC CATHETERIZATION     for SVT  . DILATION AND CURETTAGE OF UTERUS    . KNEE SURGERY Bilateral   . VSD REPAIR     closure of ventricular septal defect     OB History    Gravida  5   Para  3   Term  1   Preterm  2   AB  1   Living  2     SAB  1   TAB  0   Ectopic      Multiple  0   Live Births  2            Home Medications    Prior to  Admission medications   Medication Sig Start Date End Date Taking? Authorizing Provider  ibuprofen (ADVIL,MOTRIN) 800 MG tablet Take 1 tablet (800 mg total) by mouth every 8 (eight) hours as needed for moderate pain. 12/17/14   Richarda OverlieHolland, Richard, MD  levothyroxine (SYNTHROID, LEVOTHROID) 100 MCG tablet Take 100 mcg by mouth daily.      [provider]  Prenatal Vit-Fe Fumarate-FA (PRENATAL MULTIVITAMIN) TABS Take 1 tablet by mouth daily.    [provider]    Family History Family History  Problem Relation Age of Onset  . Cancer Paternal Uncle     Social History Social History   Tobacco Use  . Smoking status: Former Smoker  Substance Use Topics  . Alcohol use: Yes    Comment: occasional  NONE WHILE PREG  . Drug use: No     Allergies   Patient has no known allergies.   Review of Systems Review of Systems  Constitutional: Negative for fever.  Musculoskeletal:       Ankle pain  Skin: Negative for wound.  Neurological:  No head trauma or loc     Physical Exam Updated Vital Signs BP 116/74   Pulse (!) 50   Temp 98.9 F (37.2 C) (Oral)   Resp 14   Ht 5\' 6"  (1.676 m)   Wt 59 kg   LMP 05/20/2018 (Exact Date)   SpO2 100%   Breastfeeding? No   BMI 20.98 kg/m   Physical Exam  Constitutional: She appears well-developed and well-nourished. No distress.  HENT:  Head: Normocephalic and atraumatic.  Eyes: Conjunctivae are normal.  Neck: Neck supple.  Cardiovascular: Normal rate.  Pulmonary/Chest: Effort normal.  Musculoskeletal:  TTP to the left lateral malleolus.  No medial malleolar tenderness.  Achilles tendon is intact.  Distal pulses intact.  Neurovascularly intact distally.  Dorsiflexion and plantarflexion are intact with good strength.  Neurological: She is alert.  Skin: Skin is warm and dry.  Psychiatric: She has a normal mood and affect.  Nursing note and vitals reviewed.    ED Treatments / Results  Labs (all labs ordered are  listed, but only abnormal results are displayed) Labs Reviewed - No data to display  EKG None  Radiology Dg Ankle Complete Left  Result Date: 05/28/2018 CLINICAL DATA:  Pt was running today and stepped on an acorn - it caused her to roll her left ankle out - having lateral left ankle pain and swelling since EXAM: LEFT ANKLE COMPLETE - 3+ VIEW COMPARISON:  None. FINDINGS: No fracture.  No bone lesion. Ankle joint normally spaced and aligned.  No arthropathic change. Mild lateral soft tissue swelling. IMPRESSION: No fracture or dislocation Electronically Signed   By: Amie Portland M.D.   On: 05/28/2018 15:24    Procedures Procedures (including critical care time)  SPLINT APPLICATION Date/Time: 5:43 PM Authorized by: Karrie Meres Consent: Verbal consent obtained. Risks and benefits: risks, benefits and alternatives were discussed Consent given by: patient Splint applied by: orthopedic technician Location details: left ankle Splint type: ASO Post-procedure: The splinted body part was neurovascularly unchanged following the procedure. Patient tolerance: Patient tolerated the procedure well with no immediate complications.   Medications Ordered in ED Medications  ketorolac (TORADOL) 15 MG/ML injection 15 mg (has no administration in time range)     Initial Impression / Assessment and Plan / ED Course  I have reviewed the triage vital signs and the nursing notes.  Pertinent labs & imaging results that were available during my care of the patient were reviewed by me and considered in my medical decision making (see chart for details).     Final Clinical Impressions(s) / ED Diagnoses   Final diagnoses:  Sprain of left ankle, unspecified ligament, initial encounter   Patient presenting with ankle pain after twisting ankle prior to arrival.  Vital signs stable and patient nontoxic-appearing.  X-ray of left ankle negative for acute fracture abnormality.  A splint was applied and  crutches given.   patient advised to follow-up with PCP in 1 week for reevaluation.  Advised Tylenol, ibuprofen, and rice protocol for pain.  Advised to return to the ER for any new or worsening symptoms in the meantime.  All questions were answered and patient understands plan and reasons to return.  ED Discharge Orders    None       Karrie Meres, [place] 05/28/18 1743    Terrilee Files, MD 05/29/18 1055

## 2018-05-28 NOTE — ED Triage Notes (Signed)
Pt endorses twisting left ankle while running earlier today. Minor swelling noted, pedal pulse intact. VSS.

## 2018-06-23 ENCOUNTER — Other Ambulatory Visit: Payer: Self-pay

## 2018-06-23 NOTE — Telephone Encounter (Signed)
SENT REFERRAL TO SCHEDULING AND FILED NOTES 

## 2018-06-26 ENCOUNTER — Encounter: Payer: Self-pay | Admitting: Internal Medicine

## 2018-06-29 ENCOUNTER — Encounter: Payer: Self-pay | Admitting: Internal Medicine

## 2018-06-29 ENCOUNTER — Ambulatory Visit (INDEPENDENT_AMBULATORY_CARE_PROVIDER_SITE_OTHER): Payer: BLUE CROSS/BLUE SHIELD | Admitting: Internal Medicine

## 2018-06-29 ENCOUNTER — Encounter (INDEPENDENT_AMBULATORY_CARE_PROVIDER_SITE_OTHER): Payer: Self-pay

## 2018-06-29 VITALS — BP 106/70 | HR 64 | Ht 66.0 in | Wt 136.0 lb

## 2018-06-29 DIAGNOSIS — I471 Supraventricular tachycardia: Secondary | ICD-10-CM | POA: Diagnosis not present

## 2018-06-29 DIAGNOSIS — R002 Palpitations: Secondary | ICD-10-CM

## 2018-06-29 NOTE — Patient Instructions (Signed)
Medication Instructions:  Your physician recommends that you continue on your current medications as directed. Please refer to the Current Medication list given to you today. If you need a refill on your cardiac medications before your next appointment, please call your pharmacy.   Lab work: You will have labs drawn today: TSH  If you have labs (blood work) drawn today and your tests are completely normal, you will receive your results only by: Marland Kitchen MyChart Message (if you have MyChart) OR . A paper copy in the mail If you have any lab test that is abnormal or we need to change your treatment, we will call you to review the results.  Testing/Procedures: None ordered.  Follow-Up: At [facility], you and your health needs are our priority.  As part of our continuing mission to provide you with exceptional heart care, we have created designated Provider Care Teams.  These Care Teams include your primary Cardiologist (physician) and Advanced Practice Providers (APPs -  Physician Assistants and Nurse Practitioners) who all work together to provide you with the care you need, when you need it. You will need a follow up appointment in 12 months.  Please call our office 2 months in advance to schedule this appointment.  You may see Dr Graciela Husbands.   Any Other Special Instructions Will Be Listed Below (If Applicable).

## 2018-06-29 NOTE — Progress Notes (Signed)
ELECTROPHYSIOLOGY CONSULT NOTE  Patient ID: Kaylee Ellis, MRN: [id number], DOB/AGE: [birth year] 37 y.o. Admit date: (Not on file) Date of Consult: 06/29/2018  Primary Physician: Devra Dopp, MD Primary Cardiologist: SK     Kaylee Ellis is a 37 y.o. female who is being seen today for the evaluation of palpitations at the request of Dr Providence Lanius.    HPI Kaylee Ellis is a 37 y.o. female referred because palpitations.  She is a complex cardiac history.  At the age of 3 she underwent a VSD repair she developed SVT and underwent ablation at [facility] [place] for which she was told was a tachycardia unrelated to her VSD.  She continues to have palpitations that are infrequent, aggravated by caffeine and stress.  She was seen in the emergency room because of ankle injury and was told that her heart rates were dipping into the 40s.  She then got a fit bit and notices that her heart rates ranged from the 40s to the 100s.  Palpitations are most notable at night on her left side.  She feels that her throat.  She has no functional limitations except for when she lifts weights she finds that she is short of breath. Past Medical History:  Diagnosis Date  . Acne    history of  . Chest pain    history of, 2010  . Hashimoto disease    history of  . Hypothyroidism   . SVT (supraventricular tachycardia) (HCC)    history of  . VSD (ventricular septal defect)    history of      Surgical History:  Past Surgical History:  Procedure Laterality Date  . CARDIAC CATHETERIZATION     for SVT  . DILATION AND CURETTAGE OF UTERUS    . KNEE SURGERY Bilateral   . VSD REPAIR     closure of ventricular septal defect     Home Meds: Prior to Admission medications   Medication Sig Start Date End Date Taking? Authorizing Provider  Norethindrone Acetate-Ethinyl Estradiol (JUNEL,LOESTRIN,MICROGESTIN) 1.5-30 MG-MCG tablet Take 1 tablet by mouth daily. 03/21/18  Yes [provider]  thyroid (NP THYROID) 90 MG tablet Take 1 tablet by mouth daily. 01/22/18  Yes [provider]    Allergies: No Known Allergies  Social History   Socioeconomic History  . Marital status: Married    Spouse name: Not on file  . Number of children: Not on file  . Years of education: Not on file  . Highest education level: Not on file  Occupational History  . Not on file  Social Needs  . Financial resource strain: Not on file  . Food insecurity:    Worry: Not on file    Inability: Not on file  . Transportation needs:    Medical: Not on file    Non-medical: Not on file  Tobacco Use  . Smoking status: Former Games developer  . Smokeless tobacco: Never Used  Substance and Sexual Activity  . Alcohol use: Yes    Comment: occasional  NONE WHILE PREG  . Drug use: No  . Sexual activity: Not Currently  Lifestyle  . Physical activity:    Days per week: Not on file    Minutes per session: Not on file  . Stress: Not on file  Relationships  . Social connections:    Talks on phone: Not on file    Gets together: Not on file    Attends religious service: Not on  file    Active member of club or organization: Not on file    Attends meetings of clubs or organizations: Not on file    Relationship status: Not on file  . Intimate partner violence:    Fear of current or ex partner: Not on file    Emotionally abused: Not on file    Physically abused: Not on file    Forced sexual activity: Not on file  Other Topics Concern  . Not on file  Social History Narrative  . Not on file     Family History  Problem Relation Age of Onset  . Cancer Paternal Uncle      ROS:  Please see the history of present illness.     All other systems reviewed and negative.    Physical Exam: Blood pressure 106/70, pulse 64, height 5\' 6"  (1.676 m), weight 136 lb (61.7 kg), SpO2 99 %. General: Well developed, well nourished female in no acute distress. Head: Normocephalic, atraumatic, sclera  non-icteric, no xanthomas, nares are without discharge. EENT: normal  Lymph Nodes:  none Neck: Negative for carotid bruits. JVD not elevated. Back:without scoliosis kyphosis Lungs: Clear bilaterally to auscultation without wheezes, rales, or rhonchi. Breathing is unlabored. Heart: Irregular RR with S1 S2. No murmur . No rubs, or gallops appreciated. Abdomen: Soft, non-tender, non-distended with normoactive bowel sounds. No hepatomegaly. No rebound/guarding. No obvious abdominal masses. Msk:  Strength and tone appear normal for age. Extremities: No clubbing or cyanosis. No edema.  Distal pedal pulses are 2+ and equal bilaterally. Skin: Warm and Dry Neuro: Alert and oriented X 3. CN III-XII intact Grossly normal sensory and motor function . Psych:  Responds to questions appropriately with a normal affect.      Labs: Cardiac Enzymes No results for input(s): CKTOTAL, CKMB, TROPONINI in the last 72 hours. CBC Lab Results  Component Value Date   WBC 11.5 (H) 12/17/2014   HGB 10.7 (L) 12/17/2014   HCT 31.3 (L) 12/17/2014   MCV 89.9 12/17/2014   PLT 135 (L) 12/17/2014   PROTIME: No results for input(s): LABPROT, INR in the last 72 hours. Chemistry No results for input(s): NA, K, CL, CO2, BUN, CREATININE, CALCIUM, PROT, BILITOT, ALKPHOS, ALT, AST, GLUCOSE in the last 168 hours.  Invalid input(s): LABALBU Lipids No results found for: CHOL, HDL, LDLCALC, TRIG BNP No results found for: PROBNP Thyroid Function Tests: No results for input(s): TSH, T4TOTAL, T2FREE, THYROIDAB in the last 72 hours.  Invalid input(s): FREET3 Miscellaneous Lab Results  Component Value Date   DDIMER  11/17/2008    <0.22        AT THE INHOUSE ESTABLISHED CUTOFF VALUE OF 0.48 ug/mL FEU, THIS ASSAY HAS BEEN DOCUMENTED IN THE LITERATURE TO HAVE A SENSITIVITY AND NEGATIVE PREDICTIVE VALUE OF AT LEAST 98 TO 99%.  THE TEST RESULT SHOULD BE CORRELATED WITH AN ASSESSMENT OF THE CLINICAL PROBABILITY OF DVT /  VTE.    Radiology/Studies:  No results found.  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  She is glad to know what they  are.  They appeared to be from the right ventricular outflow tract thus unrelated to her VSD patch.  We will check her TSH as it has been some years since it was last evaluated  I am not sure the mechanism of the dyspnea with Valsalva.  I will reach out to the heart failure team to see what thoughts they might have.   I have reached out to Dr. DM.  One mechanism that we thought of was increasing afterload associated with increasing MR.  We will recommend an echo with Valsalva.  She is agreed to come back in the year.  I do not have an explanation for the variability in her heart rate; probably because we do not know what it is but as it is minimally symptomatic at this point, she is disinclined to pursue it.  It is hard for me to find a reason to do so in terms of risk stratification.       Sherryl Manges

## 2018-06-30 ENCOUNTER — Telehealth: Payer: Self-pay

## 2018-06-30 DIAGNOSIS — R0602 Shortness of breath: Secondary | ICD-10-CM

## 2018-06-30 LAB — TSH: TSH: 0.956 u[IU]/mL (ref 0.450–4.500)

## 2018-06-30 NOTE — Telephone Encounter (Signed)
-----   Message from Duke Salvia, MD sent at 06/29/2018  4:15 PM EDT ----- Kaylee Ellis, could you tell her that we were concerned with the shortness of breath with the weight lifting there may be something that happens with her valve when she is lifting weights.  We could look at this with an echo and bearing down if she is agreeable

## 2018-06-30 NOTE — Telephone Encounter (Signed)
Follow Up:      Returning your call from today. 

## 2018-06-30 NOTE — Telephone Encounter (Signed)
Pt agreeable to having an echo performed. Pt verbalized understanding of Dr [name] concern of SOB while weight lifting. Pt will await a call from scheduling to arrange her echo.

## 2018-06-30 NOTE — Telephone Encounter (Signed)
LVM for return call. 

## 2018-07-03 ENCOUNTER — Other Ambulatory Visit: Payer: Self-pay

## 2018-07-03 ENCOUNTER — Encounter (INDEPENDENT_AMBULATORY_CARE_PROVIDER_SITE_OTHER): Payer: Self-pay

## 2018-07-03 ENCOUNTER — Ambulatory Visit (HOSPITAL_COMMUNITY): Payer: BLUE CROSS/BLUE SHIELD | Attending: Cardiology

## 2018-07-03 DIAGNOSIS — R0602 Shortness of breath: Secondary | ICD-10-CM | POA: Diagnosis not present

## 2018-07-07 ENCOUNTER — Telehealth: Payer: Self-pay

## 2018-07-07 DIAGNOSIS — Q231 Congenital insufficiency of aortic valve: Secondary | ICD-10-CM

## 2018-07-07 NOTE — Telephone Encounter (Signed)
Pt is aware of echo results. She states she may have heard this about her valve in the past but since had slipped her mind. Pt agrees to having a CTA performed and establishing care with a primary cardiologist, Dr Tenny Craw. Pt had questions regarding restrictions with workouts and weight lifting. I advised her I would speak with Dr Graciela Husbands regarding this and contact her back. Pt verbalized understanding and had no additional questions.

## 2018-07-07 NOTE — Telephone Encounter (Signed)
Pt understands Dr Graciela Husbands would prefer she abstain from rigorous exercise until she can have her CTA done. Pt has verbalized understanding and had no additional questions.

## 2018-07-07 NOTE — Telephone Encounter (Signed)
LVM for return call. 

## 2018-07-07 NOTE — Telephone Encounter (Signed)
-----   Message from Steven C Klein, MD sent at 07/06/2018  5:22 PM EDT ----- Please Inform Patient Echo showed  normal  heart muscle function   there is evidence that her Aortic valve is bicuspid No evidence of stenosis It is recommended that her aorta be imaged >> CTA and tehn would ask her to see Dr BC ( if female ) or Dr PR if prefers female   

## 2018-07-07 NOTE — Telephone Encounter (Signed)
-----   Message from Duke Salvia, MD sent at 07/06/2018  5:22 PM EDT ----- Please Inform Patient Kaylee Ellis showed  normal  heart muscle function   there is evidence that her Aortic valve is bicuspid No evidence of stenosis It is recommended that her aorta be imaged >> CTA and tehn would ask her to see Dr [facility] ( if female ) or Dr PR if prefers female

## 2018-08-02 ENCOUNTER — Other Ambulatory Visit: Payer: Self-pay

## 2018-08-02 ENCOUNTER — Emergency Department (HOSPITAL_COMMUNITY): Payer: BLUE CROSS/BLUE SHIELD

## 2018-08-02 ENCOUNTER — Encounter (HOSPITAL_COMMUNITY): Payer: Self-pay

## 2018-08-02 ENCOUNTER — Emergency Department (HOSPITAL_COMMUNITY)
Admission: EM | Admit: 2018-08-02 | Discharge: 2018-08-02 | Disposition: A | Discharge status: Home / Self Care | Payer: BLUE CROSS/BLUE SHIELD | Attending: Emergency Medicine | Admitting: Emergency Medicine

## 2018-08-02 DIAGNOSIS — E039 Hypothyroidism, unspecified: Secondary | ICD-10-CM | POA: Diagnosis not present

## 2018-08-02 DIAGNOSIS — R002 Palpitations: Secondary | ICD-10-CM | POA: Diagnosis not present

## 2018-08-02 DIAGNOSIS — R5383 Other fatigue: Secondary | ICD-10-CM | POA: Diagnosis not present

## 2018-08-02 DIAGNOSIS — R000 Tachycardia, unspecified: Secondary | ICD-10-CM | POA: Diagnosis present

## 2018-08-02 DIAGNOSIS — Z87891 Personal history of nicotine dependence: Secondary | ICD-10-CM | POA: Insufficient documentation

## 2018-08-02 LAB — COMPREHENSIVE METABOLIC PANEL
ALK PHOS: 40 U/L (ref 38–126)
ALT: 18 U/L (ref 0–44)
AST: 19 U/L (ref 15–41)
Albumin: 4 g/dL (ref 3.5–5.0)
Anion gap: 10 (ref 5–15)
BILIRUBIN TOTAL: 0.6 mg/dL (ref 0.3–1.2)
BUN: 15 mg/dL (ref 6–20)
CALCIUM: 9.4 mg/dL (ref 8.9–10.3)
CO2: 22 mmol/L (ref 22–32)
Chloride: 104 mmol/L (ref 98–111)
Creatinine, Ser: 0.71 mg/dL (ref 0.44–1.00)
GFR calc non Af Amer: >60 mL/min (ref >60)
Glucose, Bld: 96 mg/dL (ref 70–99)
Potassium: 3.7 mmol/L (ref 3.5–5.1)
Sodium: 136 mmol/L (ref 135–145)
TOTAL PROTEIN: 7.4 g/dL (ref 6.5–8.1)

## 2018-08-02 LAB — CBC WITH DIFFERENTIAL/PLATELET
Abs Immature Granulocytes: 0.02 10*3/uL (ref 0.00–0.07)
Basophils Absolute: 0.1 10*3/uL (ref 0.0–0.1)
Basophils Relative: 1 %
EOS PCT: 2 %
Eosinophils Absolute: 0.1 10*3/uL (ref 0.0–0.5)
HCT: 39.4 % (ref 36.0–46.0)
HEMOGLOBIN: 13 g/dL (ref 12.0–15.0)
Immature Granulocytes: 0 %
LYMPHS PCT: 33 %
Lymphs Abs: 2.7 10*3/uL (ref 0.7–4.0)
MCH: 29.7 pg (ref 26.0–34.0)
MCHC: 33 g/dL (ref 30.0–36.0)
MCV: 90 fL (ref 80.0–100.0)
MONO ABS: 0.5 10*3/uL (ref 0.1–1.0)
Monocytes Relative: 6 %
Neutro Abs: 4.9 10*3/uL (ref 1.7–7.7)
Neutrophils Relative %: 58 %
Platelets: 209 10*3/uL (ref 150–400)
RBC: 4.38 MIL/uL (ref 3.87–5.11)
RDW: 11.9 % (ref 11.5–15.5)
WBC: 8.2 10*3/uL (ref 4.0–10.5)
nRBC: 0 % (ref 0.0–0.2)

## 2018-08-02 LAB — I-STAT TROPONIN, ED: Troponin i, poc: 0 ng/mL (ref 0.00–0.08)

## 2018-08-02 NOTE — Discharge Instructions (Addendum)
Increased fluids,  try increasing caffeine when heart rate is low.  The Cardiologist will call you to set up monitoring and Ct scan

## 2018-08-02 NOTE — ED Triage Notes (Signed)
Pt reports palpitations last night but more intense and frequent today. Pt c.o feeling lightheaded.

## 2018-08-02 NOTE — ED Notes (Signed)
Patient Alert and oriented to baseline. Stable and ambulatory to baseline. Patient verbalized understanding of the discharge instructions.  Patient belongings were taken by the patient.   

## 2018-08-02 NOTE — ED Notes (Signed)
Patient transported to X-ray 

## 2018-08-02 NOTE — ED Provider Notes (Signed)
[facility] Provider Note   CSN: [id number] Arrival date & time: 08/02/18  1323     History   Chief Complaint Chief Complaint  Patient presents with  . Tachycardia    HPI Kaylee Ellis is a 37 y.o. female.  The history is provided by the patient. No language interpreter was used.  Palpitations   This is a new problem. The current episode started more than 2 days ago. The problem occurs constantly. The problem has been gradually worsening. Associated symptoms include malaise/fatigue. Pertinent negatives include no chest pressure. She has tried nothing for the symptoms. The treatment provided no relief. Her past medical history is significant for heart disease and valve disorder.  Pt has a history of congenital heart disease.  Pt reports she has had episodes of her heart racing and then beating slow.  Pt reports her fitbit shows a heart rate of 40 to 120.    Past Medical History:  Diagnosis Date  . Acne    history of  . Chest pain    history of, 2010  . Hashimoto disease    history of  . Hypothyroidism   . SVT (supraventricular tachycardia) (HCC)    history of  . VSD (ventricular septal defect)    history of    Patient Active Problem List   Diagnosis Date Noted  . Active labor 12/16/2014  . Pregnancy 12/16/2014  . Encounter for fetal anatomic survey   . [redacted] weeks gestation of pregnancy   . History of preterm delivery, currently pregnant in second trimester   . Hypothyroidism affecting pregnancy in second trimester, antepartum   . S/P VSD repair   . Family history of congenital heart disease   . Fall on stairs 07/17/2014  . Palpitations 12/31/2010  . S/P VSD closure 12/31/2010  . SVT (supraventricular tachycardia) (HCC) 12/31/2010    Past Surgical History:  Procedure Laterality Date  . CARDIAC CATHETERIZATION     for SVT  . DILATION AND CURETTAGE OF UTERUS    . KNEE SURGERY Bilateral   . VSD REPAIR     closure of  ventricular septal defect     OB History    Gravida  5   Para  3   Term  1   Preterm  2   AB  1   Living  2     SAB  1   TAB  0   Ectopic      Multiple  0   Live Births  2            Home Medications    Prior to Admission medications   Medication Sig Start Date End Date Taking? Authorizing Provider  Norethindrone Acetate-Ethinyl Estradiol (JUNEL,LOESTRIN,MICROGESTIN) 1.5-30 MG-MCG tablet Take 1 tablet by mouth daily. 03/21/18   [provider]  thyroid (NP THYROID) 90 MG tablet Take 1 tablet by mouth daily. 01/22/18   [provider]    Family History Family History  Problem Relation Age of Onset  . Cancer Paternal Uncle     Social History Social History   Tobacco Use  . Smoking status: Former Games developermoker  . Smokeless tobacco: Never Used  Substance Use Topics  . Alcohol use: Yes    Comment: occasional  NONE WHILE PREG  . Drug use: No     Allergies   Patient has no known allergies.   Review of Systems Review of Systems  Constitutional: Positive for malaise/fatigue.  Cardiovascular: Positive for palpitations.  All other systems reviewed and are negative.    Physical Exam Updated Vital Signs BP 140/88 (BP Location: Right Arm)   Pulse 70   Temp 98.9 F (37.2 C) (Oral)   Resp 14   SpO2 100%   Physical Exam  Constitutional: She is oriented to person, place, and time. She appears well-developed and well-nourished.  HENT:  Head: Normocephalic.  Eyes: Pupils are equal, round, and reactive to light.  Neck: Normal range of motion.  Cardiovascular: Normal rate and regular rhythm.  Pulmonary/Chest: Effort normal.  Abdominal: Soft.  Musculoskeletal: Normal range of motion.  Neurological: She is alert and oriented to person, place, and time.  Skin: Skin is warm.  Psychiatric: She has a normal mood and affect.  Nursing note and vitals reviewed.    ED Treatments / Results  Labs (all labs ordered are listed, but only abnormal  results are displayed) Labs Reviewed  CBC WITH DIFFERENTIAL/PLATELET  COMPREHENSIVE METABOLIC PANEL  I-STAT TROPONIN, ED    EKG EKG Interpretation  Date/Time:  Sunday August 02 2018 13:29:38 EST Ventricular Rate:  66 PR Interval:    QRS Duration: 124 QT Interval:  499 QTC Calculation: 523 R Axis:   78 Text Interpretation:  Sinus rhythm IVCD, consider atypical RBBB No significant change since last tracing Confirmed by Jacalyn Lefevre [phone]) on 08/02/2018 1:32:35 PM   Radiology No results found.  Procedures Procedures (including critical care time)  Medications Ordered in ED Medications - No data to display   Initial Impression / Assessment and Plan / ED Course  I have reviewed the triage vital signs and the nursing notes.  Pertinent labs & imaging results that were available during my care of the patient were reviewed by me and considered in my medical decision making (see chart for details).     MDM  EKG reviewed. Labs normal, Cardiology consulted.  Dr. Delton See cardiology will see.   Per Dr. Kathrene Alu increase caffeine when heart rate is low.   Drink plenty of fluids.  They will contact pt to set up a holter monitor and Ct of heart  Final Clinical Impressions(s) / ED Diagnoses   Final diagnoses:  Palpitations    ED Discharge Orders    None    An After Visit Summary was printed and given to the patient.    Elson Areas, PA-C 08/02/18 1532    Jacalyn Lefevre, MD 08/02/18 1550

## 2018-08-03 ENCOUNTER — Telehealth: Payer: Self-pay

## 2018-08-03 DIAGNOSIS — I471 Supraventricular tachycardia: Secondary | ICD-10-CM

## 2018-08-03 DIAGNOSIS — R002 Palpitations: Secondary | ICD-10-CM

## 2018-08-03 DIAGNOSIS — R001 Bradycardia, unspecified: Secondary | ICD-10-CM

## 2018-08-03 NOTE — Telephone Encounter (Signed)
Order placed under Dr Graciela HusbandsKlein, pts Primary EP Cardiologist.  [facility] Omar PersonSharon Ferguson is to call the pt and arrange this appt.

## 2018-08-03 NOTE — Telephone Encounter (Signed)
-----   Message from Loa SocksIvy M Martin, LPN sent at [id number]/409811/18/2019  8:03 AM EST ----- Triage, this is a Graciela HusbandsKlein pt.  Can you call her to order a 30 day event monitor.  Dr. Delton SeeNelson sent to [facility] but didn't realize she was out.  Thanks,  Lajoyce CornersIvy   ----- Message ----- From: Lars MassonNelson, Katarina H, MD Sent: 08/02/2018   3:28 PM EST To: Loa SocksIvy M Martin, LPN, Dewain PenningPatricia H Trent, #  Hi! Could you please arrange for a 30 day event monitor (or the patch version) for this patient who was seen in the ER for bradycardia and episodes of tachycardia. Also, Dr Graciela HusbandsKlein ordered coronary CTA, it hasnt been scheduled yet, can you verify that she is on the waiting list? Thank you, Aris LotKatarina

## 2018-08-03 NOTE — Telephone Encounter (Signed)
Spoke with the patient about Dr. Lindaann SloughNelson's recommendations to order a 30 day event monitor. The patient expressed understanding and said she has more energy today. Advised the patient to call the office back if her symptoms change or worsen.

## 2018-08-05 ENCOUNTER — Ambulatory Visit (INDEPENDENT_AMBULATORY_CARE_PROVIDER_SITE_OTHER): Payer: BLUE CROSS/BLUE SHIELD

## 2018-08-05 DIAGNOSIS — R001 Bradycardia, unspecified: Secondary | ICD-10-CM

## 2018-08-05 DIAGNOSIS — R002 Palpitations: Secondary | ICD-10-CM | POA: Diagnosis not present

## 2018-08-05 DIAGNOSIS — I471 Supraventricular tachycardia, unspecified: Secondary | ICD-10-CM

## 2018-08-11 ENCOUNTER — Other Ambulatory Visit: Payer: Self-pay

## 2018-08-11 MED ORDER — METOPROLOL TARTRATE 50 MG PO TABS: ORAL_TABLET | ORAL | 0 refills | Status: DC

## 2018-08-11 NOTE — Progress Notes (Signed)
Medication order sent into pt's pharmacy for upcoming CTA. Pre procedural instructions sent to pt via MyChart. Pt is aware and had no additional questions.

## 2018-08-26 ENCOUNTER — Ambulatory Visit (HOSPITAL_COMMUNITY)
Admission: RE | Admit: 2018-08-26 | Discharge: 2018-08-26 | Disposition: A | Discharge status: Home / Self Care | Payer: BLUE CROSS/BLUE SHIELD | Source: Ambulatory Visit | Attending: Internal Medicine | Admitting: Internal Medicine

## 2018-08-26 ENCOUNTER — Ambulatory Visit (HOSPITAL_COMMUNITY): Admission: RE | Admit: 2018-08-26 | Payer: BLUE CROSS/BLUE SHIELD | Source: Ambulatory Visit

## 2018-08-26 DIAGNOSIS — I358 Other nonrheumatic aortic valve disorders: Secondary | ICD-10-CM

## 2018-08-26 DIAGNOSIS — Q231 Congenital insufficiency of aortic valve: Secondary | ICD-10-CM | POA: Insufficient documentation

## 2018-08-26 MED ORDER — NITROGLYCERIN 0.4 MG SL SUBL
SUBLINGUAL_TABLET | SUBLINGUAL | Status: AC
  Filled 2018-08-26: qty 1

## 2018-08-26 MED ORDER — IOPAMIDOL (ISOVUE-370) INJECTION 76%
80.0000 mL | Freq: Once | INTRAVENOUS | Status: AC
  Administered 2018-08-26: 80 mL via INTRAVENOUS

## 2018-08-26 MED ORDER — NITROGLYCERIN 0.4 MG SL SUBL
0.4000 mg | SUBLINGUAL_TABLET | Freq: Once | SUBLINGUAL | Status: AC
  Administered 2018-08-26: 0.4 mg via SUBLINGUAL
  Filled 2018-08-26: qty 25

## 2018-08-26 NOTE — Progress Notes (Signed)
CT scan completed. Tolerated well. D/C home walking, awake and alert. In no distress. 

## 2018-10-29 ENCOUNTER — Ambulatory Visit: Payer: BLUE CROSS/BLUE SHIELD | Admitting: Internal Medicine

## 2018-11-13 ENCOUNTER — Encounter: Payer: Self-pay | Admitting: Internal Medicine

## 2018-11-13 ENCOUNTER — Ambulatory Visit (INDEPENDENT_AMBULATORY_CARE_PROVIDER_SITE_OTHER): Payer: BLUE CROSS/BLUE SHIELD | Admitting: Internal Medicine

## 2018-11-13 VITALS — BP 104/60 | HR 71 | Ht 66.0 in | Wt 134.6 lb

## 2018-11-13 DIAGNOSIS — Z8774 Personal history of (corrected) congenital malformations of heart and circulatory system: Secondary | ICD-10-CM

## 2018-11-13 DIAGNOSIS — I351 Nonrheumatic aortic (valve) insufficiency: Secondary | ICD-10-CM

## 2018-11-13 DIAGNOSIS — R002 Palpitations: Secondary | ICD-10-CM

## 2018-11-13 NOTE — Patient Instructions (Addendum)
Medication Instructions:  No changes If you need a refill on your cardiac medications before your next appointment, please call your pharmacy.   Lab work: none If you have labs (blood work) drawn today and your tests are completely normal, you will receive your results only by: Marland Kitchen MyChart Message (if you have MyChart) OR . A paper copy in the mail If you have any lab test that is abnormal or we need to change your treatment, we will call you to review the results.  Testing/Procedures: Your physician has requested that you have an echocardiogram. Echocardiography is a painless test that uses sound waves to create images of your heart. It provides your doctor with information about the size and shape of your heart and how well your heart's chambers and valves are working. This procedure takes approximately one hour. There are no restrictions for this procedure. We will do echo in 18 months - same day as your next appointment with Dr. Tenny Craw.    Follow-Up: At [facility], you and your health needs are our priority.  As part of our continuing mission to provide you with exceptional heart care, we have created designated Provider Care Teams.  These Care Teams include your primary Cardiologist (physician) and Advanced Practice Providers (APPs -  Physician Assistants and Nurse Practitioners) who all work together to provide you with the care you need, when you need it. You will need a follow up appointment in:  18 months.  Please call our office 2 months in advance to schedule this appointment.  You may see Dr. Tenny Craw or one of the following Advanced Practice Providers on your designated Care Team: Tereso Newcomer, PA-C [place], [place] . Berton Bon, NP  Any Other Special Instructions Will Be Listed Below (If Applicable). Release for Protected Health Information signed today for records from [facility]

## 2018-11-13 NOTE — Progress Notes (Signed)
Cardiology Office Note   Date:  11/13/2018   ID:  Kaylee, Ellis 1981-02-15, MRN [id number]  PCP:  Devra Dopp, MD  Cardiologist:   Dietrich Pates, MD   Pt presents for f/u of valvular heart dz     History of Present Illness: Kaylee Ellis is a 38 y.o. 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  There was no CAD noted   Holter monitor  Pending    THe pt remains active   She exercises but has not done weight lifting with kettle ball (what she used when she had SOB )   Notes occasional palpitations          Current Meds  Medication Sig  . clindamycin (CLEOCIN T) 1 % lotion Apply 1 application topically as directed.  . metoprolol tartrate (LOPRESSOR) 50 MG tablet Take one tablet by mouth 2 hours prior to your cardiac CTA.  Marland Kitchen Norethindrone Acetate-Ethinyl Estradiol (JUNEL,LOESTRIN,MICROGESTIN) 1.5-30 MG-MCG tablet Take 1 tablet by mouth daily.  Marland Kitchen spironolactone (ALDACTONE) 100 MG tablet Take 100 mg by mouth daily. with food  . thyroid (NP THYROID) 90 MG tablet Take 1 tablet by mouth daily.  . [DISCONTINUED] spironolactone (ALDACTONE) 50 MG tablet Take 50 mg by mouth daily. with food     Allergies:   Patient has no known allergies.   Past Medical History:  Diagnosis Date  . Acne    history of  . Chest pain    history of, 2010  . Hashimoto disease    history of  . Hypothyroidism   . SVT (supraventricular tachycardia) (HCC)    history of  . VSD (ventricular septal defect)     history of    Past Surgical History:  Procedure Laterality Date  . CARDIAC CATHETERIZATION     for SVT  . DILATION AND CURETTAGE OF UTERUS    . KNEE SURGERY Bilateral   . VSD REPAIR     closure of ventricular septal defect     Social History:  The patient  reports that she has quit smoking. She has never used smokeless tobacco. She reports current alcohol use. She reports that she does not use drugs.   Family History:  The patient's family history includes Cancer in her paternal uncle.    ROS:  Please see the history of present illness. All other systems are reviewed and  Negative to the above problem except as noted.    PHYSICAL EXAM: VS:  BP 104/60   Pulse 71   Ht 5\' 6"  (1.676 m)   Wt 134 lb 9.6 oz (61.1 kg)   SpO2 98%   BMI 21.73 kg/m   GEN: Well nourished, well developed, in no acute distress  HEENT: normal  Neck: no JVD, carotid bruits, or masses Cardiac: RRR; no  murmurs, rubs, or gallops,no edema  Respiratory:  clear to auscultation bilaterally, normal work of breathing GI: soft, nontender, nondistended, + BS  No hepatomegaly  MS: no deformity Moving all extremities   Skin: warm and dry, no rash Neuro:  Strength and sensation are intact Psych: euthymic mood, full affect   EKG:  EKG is not ordered today.   Lipid Panel No results found for: CHOL, TRIG, HDL, CHOLHDL, VLDL, LDLCALC, LDLDIRECT    Wt Readings from Last 3 Encounters:  11/13/18 134 lb 9.6 oz (61.1 kg)  06/29/18 136 lb (61.7 kg)  05/28/18 130 lb (59 kg)      ASSESSMENT AND PLAN:  1   Hx VSD  S/p repair    No residual VSD on echo   No murmur on exam  2  Hx AV dz   I have reviewed echo   AV appears trileaflet with some thickening and calcificatoin and mild AI    I will review CT For now I would recomm an ech in 18 months     Will get [facility]  3   Palpitations    Different from SVT   Pt has event monitor pending   Follow   Stay hydrated    4   Dyspnea    One episode while lifting kettle ball    CT neg    I think may have been due to several factors     I have encouraged her to stay active   When she does she needs to stay hydrated     5  Hx SVT  S/p ablation   Current medicines are reviewed at length with the patient today.  The patient does not have concerns regarding medicines.  Signed, Dietrich Pates, MD  11/13/2018 2:32 PM    [facility] [address] [place], [place], [place]  [address] Phone: [phone]; Fax: [phone]

## 2019-04-20 ENCOUNTER — Other Ambulatory Visit: Payer: Self-pay | Admitting: Obstetrics and Gynecology

## 2019-04-20 ENCOUNTER — Other Ambulatory Visit: Payer: Self-pay | Admitting: Neurosurgery

## 2019-04-20 DIAGNOSIS — N6320 Unspecified lump in the left breast, unspecified quadrant: Secondary | ICD-10-CM

## 2019-04-21 ENCOUNTER — Ambulatory Visit
Admission: RE | Admit: 2019-04-21 | Discharge: 2019-04-21 | Disposition: A | Discharge status: Home / Self Care | Payer: BLUE CROSS/BLUE SHIELD | Source: Ambulatory Visit | Attending: Obstetrics and Gynecology | Admitting: Obstetrics and Gynecology

## 2019-04-21 ENCOUNTER — Other Ambulatory Visit: Payer: Self-pay

## 2019-04-21 ENCOUNTER — Other Ambulatory Visit: Payer: Self-pay | Admitting: Obstetrics and Gynecology

## 2019-04-21 DIAGNOSIS — N6320 Unspecified lump in the left breast, unspecified quadrant: Secondary | ICD-10-CM

## 2019-09-07 IMAGING — MG DIGITAL DIAGNOSTIC BILATERAL MAMMOGRAM WITH TOMO AND CAD
6 of 10 series · 6 of 30 positions shown · non-contrast
Comparison: Previous exam(s).

CLINICAL DATA: 38-year-old female with palpable lumps in the OUTER
LEFT breast and in the LOWER LEFT breast identified on
self-examination.

EXAM:
DIGITAL DIAGNOSTIC BILATERAL MAMMOGRAM WITH CAD AND TOMO
ULTRASOUND LEFT BREAST

[L CC synth-2D (1 of 2)]
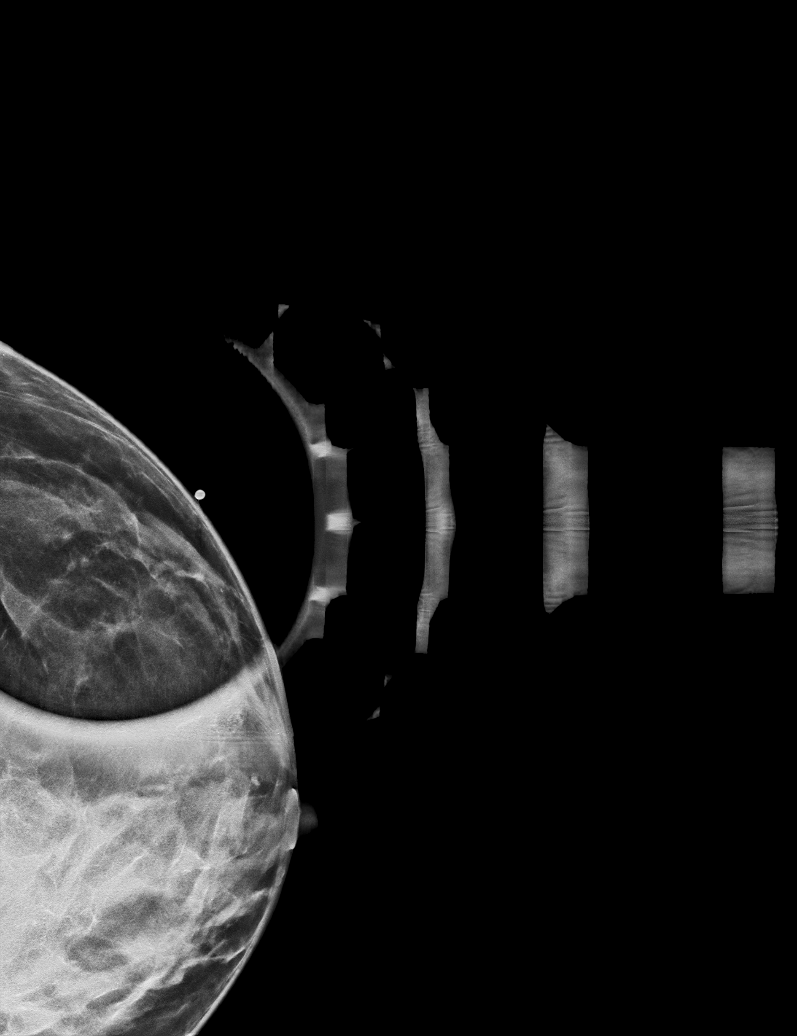

[R CC synth-2D]
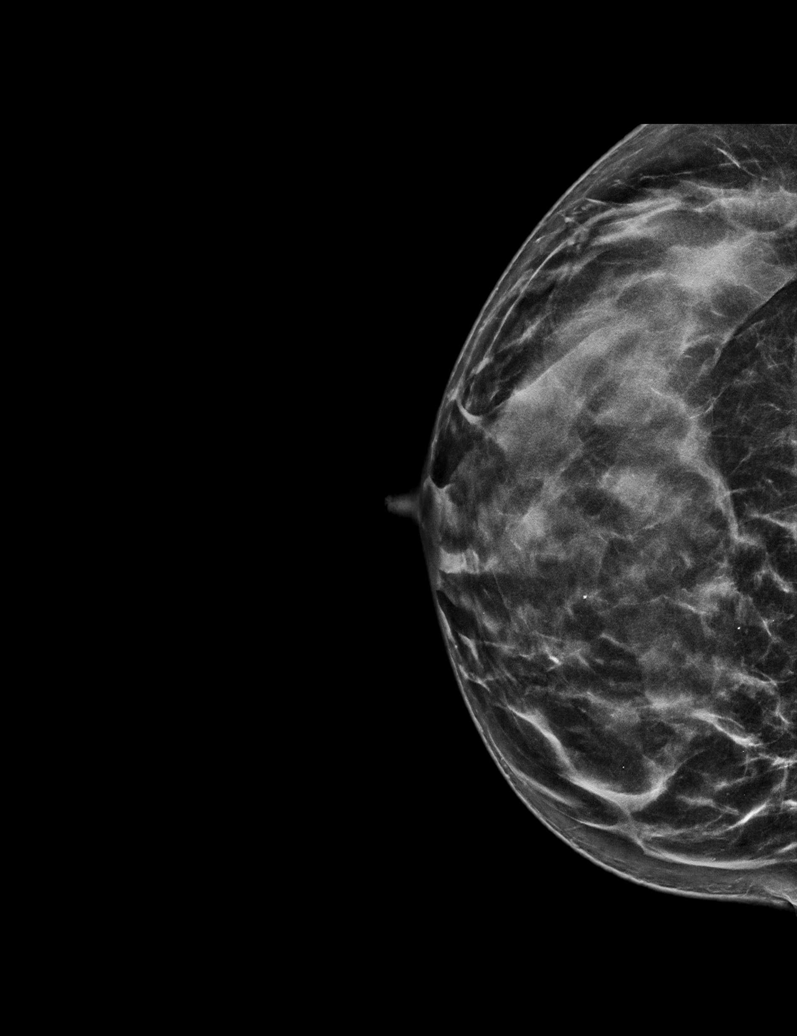

[L MLO synth-2D]
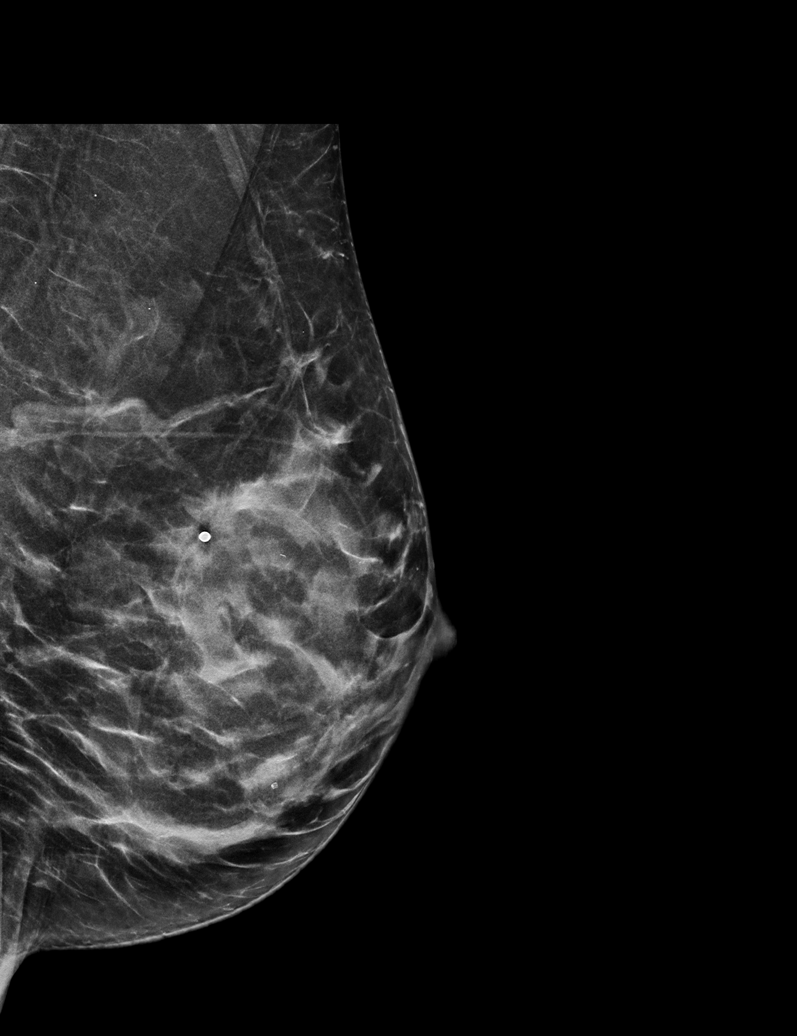

[R MLO synth-2D]
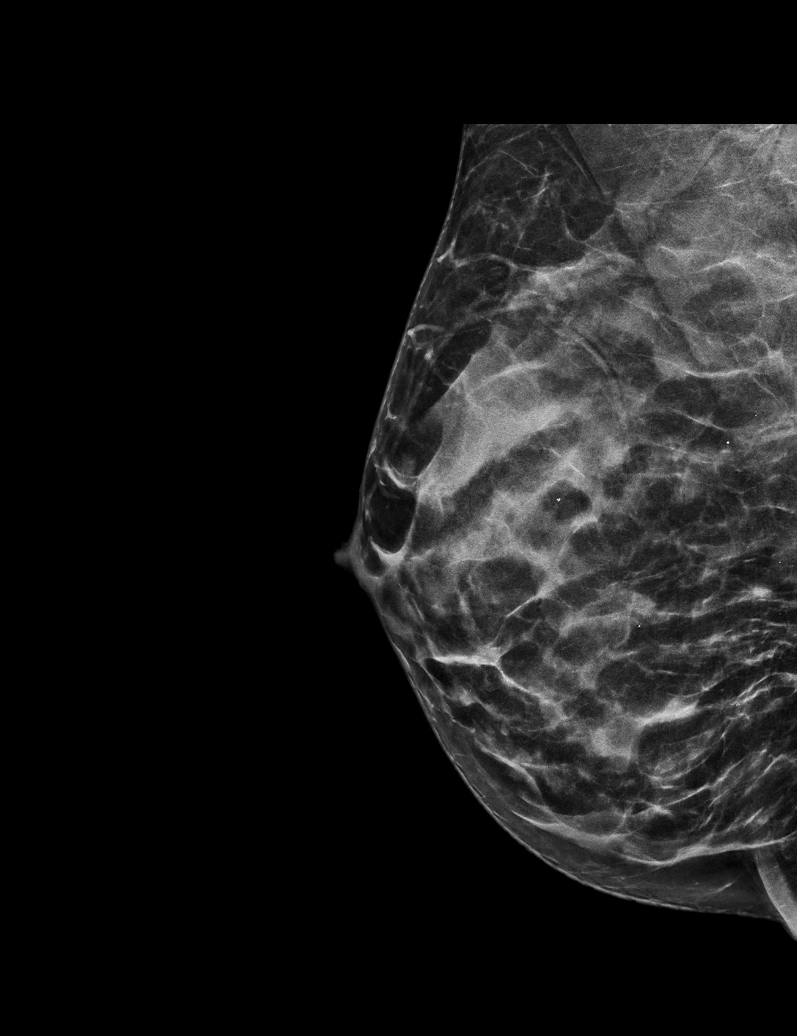

[L CC synth-2D (2 of 2)]
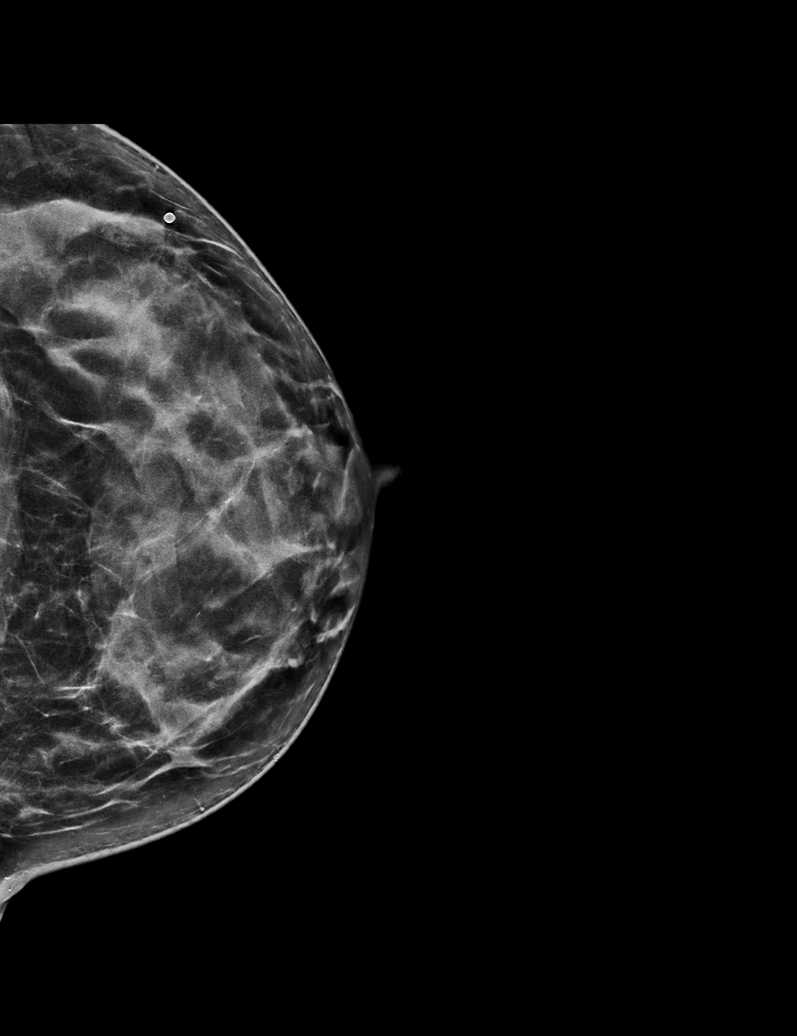

[L CC tomo · tomo slice 23/46.0]
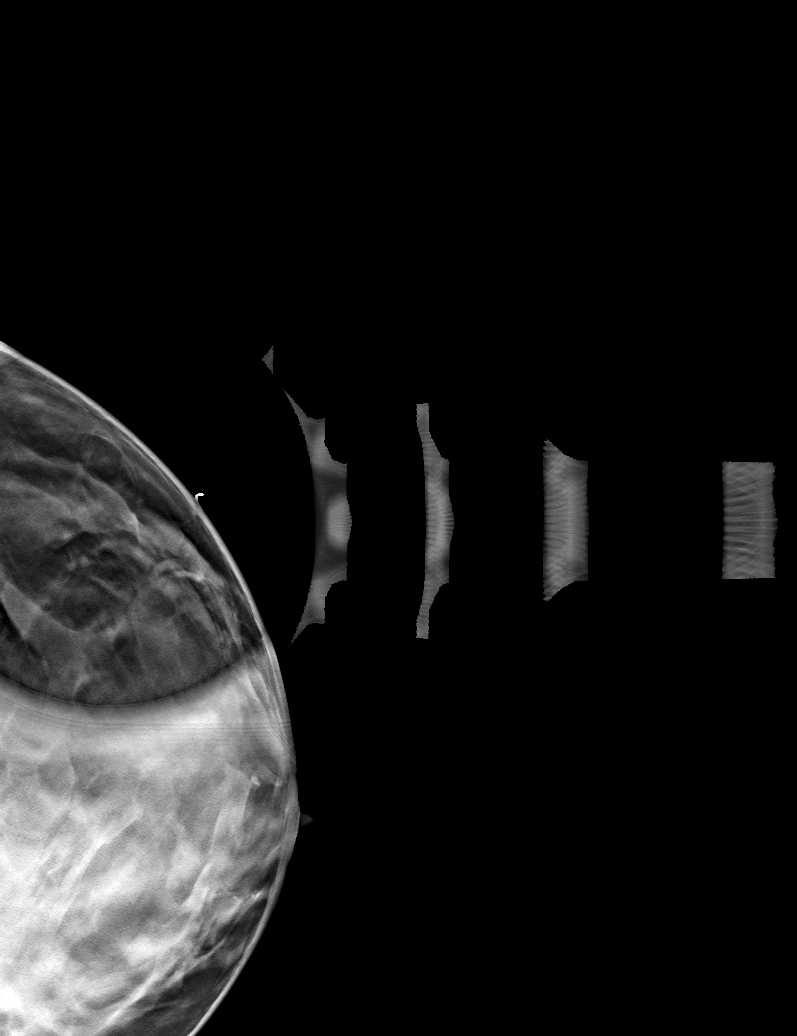

[6 of 30 positions shown; findings below may reference images not displayed]

ACR Breast Density Category c: The breast tissue is heterogeneously
dense, which may obscure small masses.
FINDINGS: 2D/3D full field views of both breasts demonstrate no suspicious
mass, distortion or worrisome calcifications.

Mammographic images were processed with CAD.

On physical exam, minimal nodularity in the LOWER LEFT breast, at
the site of patient concern.

Targeted ultrasound is performed, showing no solid or cystic mass,
distortion or abnormal shadowing within the OUTER or LOWER LEFT
breast, at the sites of patient concern.
IMPRESSION: 1. No mammographic, suspicious palpable or sonographic abnormality
within the LEFT breast, at the sites of patient concern.
2. No mammographic evidence of breast malignancy.

RECOMMENDATION:
Bilateral screening mammogram at age 41.

Clinical follow-up recommended for the area of concern in the LEFT
breast. Any further workup should be based on clinical grounds.

I have discussed the findings and recommendations with the patient.
If applicable, a reminder letter will be sent to the patient
regarding the next appointment.

BI-RADS CATEGORY  1: Negative.

## 2019-09-07 IMAGING — US ULTRASOUND LEFT BREAST LIMITED
1 series · 2 of 2 positions shown · non-contrast
Comparison: Previous exam(s).

CLINICAL DATA: 38-year-old female with palpable lumps in the OUTER
LEFT breast and in the LOWER LEFT breast identified on
self-examination.

EXAM:
DIGITAL DIAGNOSTIC BILATERAL MAMMOGRAM WITH CAD AND TOMO
ULTRASOUND LEFT BREAST

[Series 1: ultrasound left breast limited · 0.05mm/px · 2 of 2 slices shown]
[im 1/2]
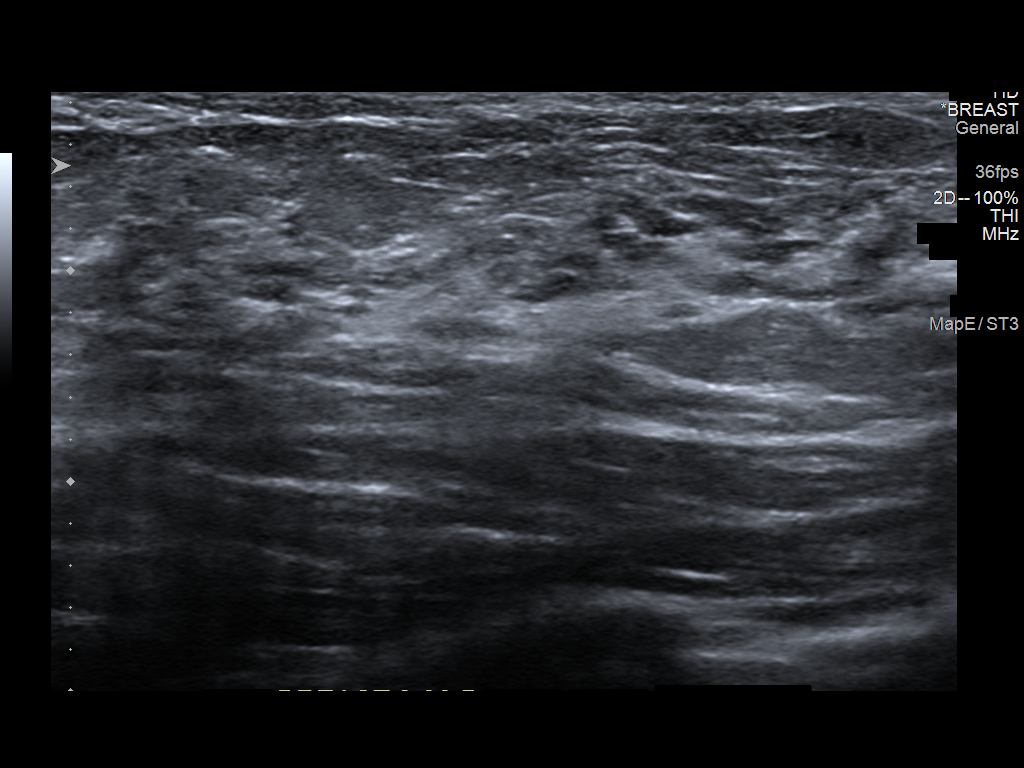
[im 2/2]
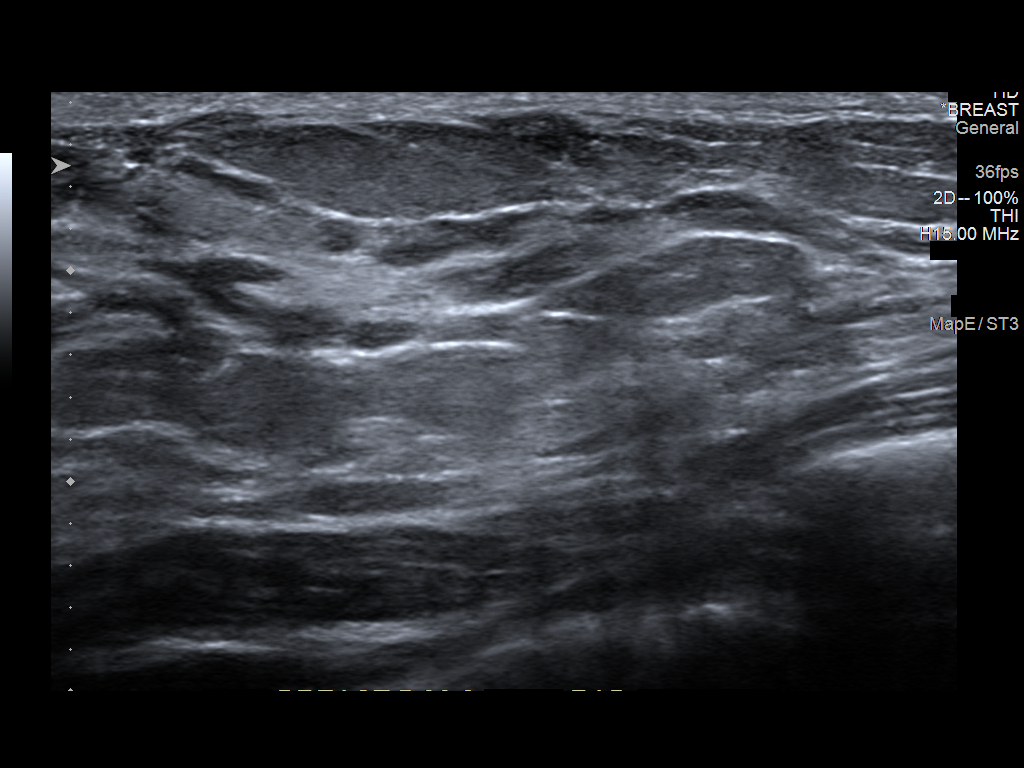

[2 of 2 positions shown; findings below may reference images not displayed]

ACR Breast Density Category c: The breast tissue is heterogeneously
dense, which may obscure small masses.
FINDINGS: 2D/3D full field views of both breasts demonstrate no suspicious
mass, distortion or worrisome calcifications.

Mammographic images were processed with CAD.

On physical exam, minimal nodularity in the LOWER LEFT breast, at
the site of patient concern.

Targeted ultrasound is performed, showing no solid or cystic mass,
distortion or abnormal shadowing within the OUTER or LOWER LEFT
breast, at the sites of patient concern.
IMPRESSION: 1. No mammographic, suspicious palpable or sonographic abnormality
within the LEFT breast, at the sites of patient concern.
2. No mammographic evidence of breast malignancy.

RECOMMENDATION:
Bilateral screening mammogram at age 41.

Clinical follow-up recommended for the area of concern in the LEFT
breast. Any further workup should be based on clinical grounds.

I have discussed the findings and recommendations with the patient.
If applicable, a reminder letter will be sent to the patient
regarding the next appointment.

BI-RADS CATEGORY  1: Negative.

## 2021-08-22 ENCOUNTER — Ambulatory Visit (INDEPENDENT_AMBULATORY_CARE_PROVIDER_SITE_OTHER): Payer: 59 | Admitting: Internal Medicine

## 2021-08-22 ENCOUNTER — Other Ambulatory Visit: Payer: Self-pay

## 2021-08-22 ENCOUNTER — Encounter: Payer: Self-pay | Admitting: Internal Medicine

## 2021-08-22 VITALS — BP 118/72 | HR 54 | Ht 65.0 in | Wt 133.6 lb

## 2021-08-22 DIAGNOSIS — Z8774 Personal history of (corrected) congenital malformations of heart and circulatory system: Secondary | ICD-10-CM | POA: Diagnosis not present

## 2021-08-22 DIAGNOSIS — Q231 Congenital insufficiency of aortic valve: Secondary | ICD-10-CM | POA: Diagnosis not present

## 2021-08-22 DIAGNOSIS — I471 Supraventricular tachycardia: Secondary | ICD-10-CM

## 2021-08-22 DIAGNOSIS — R002 Palpitations: Secondary | ICD-10-CM | POA: Diagnosis not present

## 2021-08-22 NOTE — Progress Notes (Signed)
Cardiology Office Note   Date:  08/22/2021   ID:  Kaylee Ellis, DOB 1981/03/12, MRN [id number]  PCP:  Devra Dopp, MD  Cardiologist:   Dietrich Pates, MD   Pt presents for f/u of valvular heart dz     History of Present Illness: Kaylee Ellis is a 40 y.o. 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   Norethindrone Acetate-Ethinyl Estradiol (JUNEL,LOESTRIN,MICROGESTIN) 1.5-30 MG-MCG tablet Take 1 tablet by mouth daily.   spironolactone (ALDACTONE) 100 MG tablet Take 100 mg by mouth daily. with food   thyroid (ARMOUR) 90 MG tablet Take 1 tablet by mouth daily.     Allergies:   Patient has no known allergies.   Past Medical History:  Diagnosis Date   Acne    history of   Chest pain    history of, 2010   Hashimoto disease    history of   Hypothyroidism    SVT (supraventricular tachycardia) (HCC)    history of   VSD (ventricular septal defect)    history of    Past Surgical History:  Procedure Laterality Date   CARDIAC CATHETERIZATION     for SVT   DILATION AND CURETTAGE OF UTERUS     KNEE SURGERY Bilateral    VSD REPAIR     closure of ventricular septal defect     Social History:  The patient  reports that she has quit smoking. She has never used smokeless tobacco. She reports current  alcohol use. She reports that she does not use drugs.   Family History:  The patient's family history includes Breast cancer in her cousin; Cancer in her paternal uncle.    ROS:  Please see the history of present illness. All other systems are reviewed and  Negative to the above problem except as noted.    PHYSICAL EXAM: VS:  BP 118/72   Pulse (!) 54   Ht 5\' 5"  (1.651 m)   Wt 133 lb 9.6 oz (60.6 kg)   SpO2 99%   BMI 22.23 kg/m   GEN: Well nourished, well developed, in no acute distress  HEENT: normal  Neck: no JVD, carotid bruits Cardiac: RRR;Gr II/VI systolic murmur base   No LE edema  Respiratory:  clear to auscultation bilaterally, normal work of breathing GI: soft, nontender, nondistended, + BS  No hepatomegaly  MS: no deformity Moving all extremities   Skin: warm and dry, no rash Neuro:  Strength and sensation are intact Psych: euthymic mood, full affect   EKG:  EKG is ordered today.  SB 54   Incomp RBBB  Echo  06/2018 - Left ventricle: The cavity size was normal. There was mild focal    basal hypertrophy of the septum. Systolic function was normal.    The estimated ejection fraction was in the range of 60% to 65%.    Wall motion was normal; there were no regional wall motion    abnormalities. Left ventricular diastolic function parameters    were normal.  - Aortic valve: Functionally bicuspid; moderately thickened, mildly    calcified leaflets. Transvalvular velocity was within the normal    range. There was no stenosis. There was mild regurgitation.  - Mitral valve: There was mild regurgitation.  - Right ventricle: Systolic function was mildly reduced.  - Right atrium: The atrium was normal in size.  - Tricuspid valve: There was mild regurgitation.  - Pulmonary arteries: Systolic pressure was mildly increased. PA    peak pressure: 36 mm Hg (S).  - Pericardium, extracardiac: There was no pericardial effusion.   Impressions:   - Normal LVEF, normal diastolic  function. RVEF is mildly decreased.    There is mild pulmonary hypertension. A patch is seen in the    perimembranous portion of the interventricular septum with no    residual flow. Aortic valve is functionally bicuspid with fused    non-coronary and right coronary leaflets.    Mitral regurgitation is mild and changes to mild to moderate with    Valsalva maneuver.   Coronary CT angiogram  08/26/18  Aorta: Normal size. Aortic root 3.4 cm. Ascending aorta 2.8cm. Aortic arch not visualized. No calcifications. No dissection or aneurysm of the descending aorta.   Aortic Valve: Bicuspid. Fusion and mild calcification of the non and right coronary cusps.   Coronary Arteries:  Normal coronary origin.  Right dominance.   RCA is a large dominant artery that gives rise to PDA and PLVB. There is no plaque.   Left main is a large artery that gives rise to LAD and LCX arteries.   LAD is a large vessel that has no plaque.   LCX is a non-dominant artery that gives rise to one large OM1 branch. There is no plaque.   Other findings:   Normal pulmonary vein drainage into the left atrium.   Normal let atrial appendage without a thrombus.   Normal size of the pulmonary artery.   No flow noted across perimembranous VSD repair.   IMPRESSION: 1. Coronary calcium score of 0. This was 0 percentile for age and sex matched control.   2. Normal coronary origin with right dominance.   3. No evidence of CAD.   4. Bicuspid aortic valve with fusion of the non and right coronary cusps.   5.  No flow noted across perimembranous VSD repair   Chilton Si, MD    Lipid Panel No results found for: CHOL, TRIG, HDL, CHOLHDL, VLDL, LDLCALC, LDLDIRECT    Wt Readings from Last 3 Encounters:  08/22/21 133 lb 9.6 oz (60.6 kg)  11/13/18 134 lb 9.6 oz (61.1 kg)  06/29/18 136 lb (61.7 kg)      ASSESSMENT AND PLAN:  1   Hx VSD  S/p repair    No residual VSD on echo or CT   2  Hx AV dz    Functionally bicuspid AV  Will get an echo to reevaluate      3   Palpitations    Hx of SVT (s/p ablation)   No recurrentce  Current spells different   Has worn monitor in the past  This showed rare isolated PAC, PVC  SHe says she sometimes gets isolated skips that can last a few hours    Knows some things can trigger    I would follow   Could try low dose b blocker if she needs      5  Hx SVT  S/p ablation   Current medicines are reviewed at length with the patient today.  The patient does not have concerns regarding medicines.  Signed, Dietrich Pates, MD  08/22/2021 4:46 PM    [facility] [address] [place], [place], [place]  [address] Phone: [phone]; Fax: [phone]

## 2021-08-22 NOTE — Patient Instructions (Signed)
Medication Instructions:  Your physician recommends that you continue on your current medications as directed. Please refer to the Current Medication list given to you today.  *If you need a refill on your cardiac medications before your next appointment, please call your pharmacy*   Lab Work: Fasting LIPOMED day of your Echo   If you have labs (blood work) drawn today and your tests are completely normal, you will receive your results only by: MyChart Message (if you have MyChart) OR A paper copy in the mail If you have any lab test that is abnormal or we need to change your treatment, we will call you to review the results.   Testing/Procedures: Your physician has requested that you have an echocardiogram. Echocardiography is a painless test that uses sound waves to create images of your heart. It provides your doctor with information about the size and shape of your heart and how well your heart's chambers and valves are working. This procedure takes approximately one hour. There are no restrictions for this procedure.    Follow-Up: At [facility], you and your health needs are our priority.  As part of our continuing mission to provide you with exceptional heart care, we have created designated Provider Care Teams.  These Care Teams include your primary Cardiologist (physician) and Advanced Practice Providers (APPs -  Physician Assistants and Nurse Practitioners) who all work together to provide you with the care you need, when you need it.  We recommend signing up for the patient portal called "MyChart".  Sign up information is provided on this After Visit Summary.  MyChart is used to connect with patients for Virtual Visits (Telemedicine).  Patients are able to view lab/test results, encounter notes, upcoming appointments, etc.  Non-urgent messages can be sent to your provider as well.   To learn more about what you can do with MyChart, go to [email or web address].    Your next  appointment:    18 months   The format for your next appointment:   In Person  Provider:   Dr. Dietrich Pates  If MD is not listed, click here to update    :1}    Other Instructions

## 2021-09-13 ENCOUNTER — Ambulatory Visit (HOSPITAL_COMMUNITY): Payer: 59 | Attending: Internal Medicine

## 2021-09-13 ENCOUNTER — Other Ambulatory Visit: Payer: 59 | Admitting: *Deleted

## 2021-09-13 ENCOUNTER — Other Ambulatory Visit: Payer: Self-pay

## 2021-09-13 DIAGNOSIS — Z8774 Personal history of (corrected) congenital malformations of heart and circulatory system: Secondary | ICD-10-CM | POA: Diagnosis present

## 2021-09-13 DIAGNOSIS — I471 Supraventricular tachycardia: Secondary | ICD-10-CM | POA: Diagnosis not present

## 2021-09-13 DIAGNOSIS — R002 Palpitations: Secondary | ICD-10-CM | POA: Diagnosis present

## 2021-09-13 DIAGNOSIS — Q231 Congenital insufficiency of aortic valve: Secondary | ICD-10-CM | POA: Insufficient documentation

## 2021-09-13 LAB — ECHOCARDIOGRAM COMPLETE
Area-P 1/2: 3.27 cm2
P 1/2 time: 805 msec
S' Lateral: 3 cm

## 2021-09-14 LAB — NMR, LIPOPROFILE
Cholesterol, Total: 217 mg/dL — ABNORMAL HIGH (ref 100–199)
HDL Particle Number: 42.9 umol/L (ref >=30.5)
HDL-C: 75 mg/dL (ref >39)
LDL Particle Number: 1690 nmol/L — ABNORMAL HIGH (ref <1000)
LDL Size: 20.9 nm (ref >20.5)
LDL-C (NIH Calc): 127 mg/dL — ABNORMAL HIGH (ref 0–99)
LP-IR Score: 36 (ref <=45)
Small LDL Particle Number: 598 nmol/L — ABNORMAL HIGH (ref <=527)
Triglycerides: 84 mg/dL (ref 0–149)

## 2021-09-14 LAB — LIPOPROTEIN A (LPA): Lipoprotein (a): 17.3 nmol/L (ref <75.0)

## 2021-09-14 LAB — APOLIPOPROTEIN B: Apolipoprotein B: 105 mg/dL — ABNORMAL HIGH (ref <90)

## 2021-09-28 ENCOUNTER — Telehealth: Payer: Self-pay

## 2021-09-28 DIAGNOSIS — Z79899 Other long term (current) drug therapy: Secondary | ICD-10-CM

## 2021-09-28 DIAGNOSIS — E785 Hyperlipidemia, unspecified: Secondary | ICD-10-CM

## 2021-09-28 NOTE — Telephone Encounter (Signed)
The patient has been notified of the result and verbalized understanding.  All questions (if any) were answered.     

## 2021-09-28 NOTE — Telephone Encounter (Signed)
-----   Message from Pricilla Riffle, MD sent at 09/27/2021  4:05 PM EST ----- Confirming that msg from 1/11 sent/received

## 2021-12-21 ENCOUNTER — Other Ambulatory Visit: Payer: Self-pay | Admitting: Obstetrics and Gynecology

## 2021-12-21 DIAGNOSIS — Z1231 Encounter for screening mammogram for malignant neoplasm of breast: Secondary | ICD-10-CM

## 2021-12-24 ENCOUNTER — Ambulatory Visit
Admission: RE | Admit: 2021-12-24 | Discharge: 2021-12-24 | Disposition: A | Discharge status: Home / Self Care | Payer: 59 | Source: Ambulatory Visit | Attending: Obstetrics and Gynecology | Admitting: Obstetrics and Gynecology

## 2021-12-24 DIAGNOSIS — Z1231 Encounter for screening mammogram for malignant neoplasm of breast: Secondary | ICD-10-CM

## 2022-01-14 ENCOUNTER — Other Ambulatory Visit: Payer: Self-pay

## 2022-01-14 ENCOUNTER — Emergency Department (HOSPITAL_BASED_OUTPATIENT_CLINIC_OR_DEPARTMENT_OTHER): Payer: 59 | Admitting: Radiology

## 2022-01-14 ENCOUNTER — Emergency Department (HOSPITAL_BASED_OUTPATIENT_CLINIC_OR_DEPARTMENT_OTHER)
Admission: EM | Admit: 2022-01-14 | Discharge: 2022-01-15 | Disposition: A | Discharge status: Home / Self Care | Payer: 59 | Attending: Emergency Medicine | Admitting: Emergency Medicine

## 2022-01-14 ENCOUNTER — Encounter: Payer: Self-pay | Admitting: Internal Medicine

## 2022-01-14 ENCOUNTER — Emergency Department (HOSPITAL_BASED_OUTPATIENT_CLINIC_OR_DEPARTMENT_OTHER): Payer: 59

## 2022-01-14 ENCOUNTER — Encounter (HOSPITAL_BASED_OUTPATIENT_CLINIC_OR_DEPARTMENT_OTHER): Payer: Self-pay

## 2022-01-14 DIAGNOSIS — R0789 Other chest pain: Secondary | ICD-10-CM

## 2022-01-14 DIAGNOSIS — Z8774 Personal history of (corrected) congenital malformations of heart and circulatory system: Secondary | ICD-10-CM | POA: Diagnosis not present

## 2022-01-14 LAB — TROPONIN I (HIGH SENSITIVITY)
Troponin I (High Sensitivity): <2 ng/L (ref <18)
Troponin I (High Sensitivity): <2 ng/L (ref <18)

## 2022-01-14 LAB — CBC WITH DIFFERENTIAL/PLATELET
Abs Immature Granulocytes: 0.01 10*3/uL (ref 0.00–0.07)
Basophils Absolute: 0 10*3/uL (ref 0.0–0.1)
Basophils Relative: 1 %
Eosinophils Absolute: 0.1 10*3/uL (ref 0.0–0.5)
Eosinophils Relative: 2 %
HCT: 38.5 % (ref 36.0–46.0)
Hemoglobin: 12.7 g/dL (ref 12.0–15.0)
Immature Granulocytes: 0 %
Lymphocytes Relative: 48 %
Lymphs Abs: 3.1 10*3/uL (ref 0.7–4.0)
MCH: 29.5 pg (ref 26.0–34.0)
MCHC: 33 g/dL (ref 30.0–36.0)
MCV: 89.3 fL (ref 80.0–100.0)
Monocytes Absolute: 0.5 10*3/uL (ref 0.1–1.0)
Monocytes Relative: 8 %
Neutro Abs: 2.6 10*3/uL (ref 1.7–7.7)
Neutrophils Relative %: 41 %
Platelets: 209 10*3/uL (ref 150–400)
RBC: 4.31 MIL/uL (ref 3.87–5.11)
RDW: 11.9 % (ref 11.5–15.5)
WBC: 6.3 10*3/uL (ref 4.0–10.5)
nRBC: 0 % (ref 0.0–0.2)

## 2022-01-14 LAB — COMPREHENSIVE METABOLIC PANEL
ALT: 18 U/L (ref 0–44)
AST: 18 U/L (ref 15–41)
Albumin: 4.2 g/dL (ref 3.5–5.0)
Alkaline Phosphatase: 52 U/L (ref 38–126)
Anion gap: 9 (ref 5–15)
BUN: 15 mg/dL (ref 6–20)
CO2: 27 mmol/L (ref 22–32)
Calcium: 9.4 mg/dL (ref 8.9–10.3)
Chloride: 102 mmol/L (ref 98–111)
Creatinine, Ser: 0.74 mg/dL (ref 0.44–1.00)
GFR, Estimated: >60 mL/min (ref >60)
Glucose, Bld: 69 mg/dL — ABNORMAL LOW (ref 70–99)
Potassium: 3.7 mmol/L (ref 3.5–5.1)
Sodium: 138 mmol/L (ref 135–145)
Total Bilirubin: 0.2 mg/dL — ABNORMAL LOW (ref 0.3–1.2)
Total Protein: 6.9 g/dL (ref 6.5–8.1)

## 2022-01-14 LAB — LIPASE, BLOOD: Lipase: 29 U/L (ref 11–51)

## 2022-01-14 LAB — PREGNANCY, URINE: Preg Test, Ur: NEGATIVE

## 2022-01-14 LAB — D-DIMER, QUANTITATIVE: D-Dimer, Quant: 0.66 ug/mL-FEU — ABNORMAL HIGH (ref 0.00–0.50)

## 2022-01-14 MED ORDER — IOHEXOL 350 MG/ML SOLN
100.0000 mL | Freq: Once | INTRAVENOUS | Status: AC | PRN
  Administered 2022-01-14: 75 mL via INTRAVENOUS

## 2022-01-14 NOTE — ED Triage Notes (Signed)
Pt c/o sharp intermittent chest pain located mid-sternally x 1 week. Today pt had an episode while driving that was more severe and lasted longer, approx 10 min. Pain does not radiate. Pt states inhalation did worsen the pain. Pt denies ShOB, diaphoresis, or nausea. Pt has no pain during triage.  ?

## 2022-01-14 NOTE — ED Notes (Signed)
Pt called out for 6-10 chest pain, ekg captured and given to provider ?

## 2022-01-14 NOTE — ED Provider Notes (Signed)
?[facility] DE[facility] ?Provider Note ? ? ?CSN: [id number] ?Arrival date & time: 01/14/22  1723 ? ?  ? ?History ? ?Chief Complaint  ?Patient presents with  ? Chest Pain  ? ? ?Kaylee Ellis is a 41 y.o. female followed by Dr. Harrington Challenger of cardiology with a past medical history of VSD closure at age 8, bicuspid aortic valve, SVT status post ablation who presents today for evaluation of chest pain. ?She states that over the past week she has had intermittent chest pain.  It is in the left side of her chest.  It does not radiate or move.  She states that it normally last for under 5 minutes.  She cannot identify any aggravating or alleviating factors. ?Today while driving her pain lasted for 10 minutes and was more severe than previous.  She reached out to her cardiologist office and they referred her to the emergency room.  She states that otherwise she has been feeling well recently.  She denies any cough or shortness of breath.  She reports compliance with all of her medications.  She denies any leg swelling however does take OCPs. ? ?She has no history of DVT/PE.  She has not been having palpitations.  She recently had her thyroid numbers checked and they were "good" per her report. ? ?Last recent echo was 09/13/2021.  At that point she had a LVEF of 60 to 65% with normal right ventricular function, no stenosis with mild aortic insufficiency. ? ?HPI ? ?  ? ?Home Medications ?Prior to Admission medications   ?Medication Sig Start Date End Date Taking? Authorizing Provider  ?clindamycin (CLEOCIN T) 1 % lotion Apply 1 application topically as directed. 11/05/18   [provider]  ?metoprolol tartrate (LOPRESSOR) 50 MG tablet Take one tablet by mouth 2 hours prior to your cardiac CTA. ?Patient not taking: Reported on 08/22/2021 08/11/18   Deboraha Sprang, MD  ?Norethindrone Acetate-Ethinyl Estradiol (JUNEL,LOESTRIN,MICROGESTIN) 1.5-30 MG-MCG tablet Take 1 tablet by mouth daily. 03/21/18   [provider]  ?spironolactone (ALDACTONE) 100 MG tablet Take 100 mg by mouth daily. with food 11/05/18   [provider]  ?thyroid (ARMOUR) 90 MG tablet Take 1 tablet by mouth daily. 01/22/18   [provider]  ?tretinoin (RETIN-A) 0.05 % cream Apply 1 application topically as directed. Acne 3x weekly    [provider]  ?   ? ?Allergies    ?Patient has no known allergies.   ? ?Review of Systems   ?Review of Systems ?See above ? ?Physical Exam ?Updated Vital Signs ?BP 109/71   Pulse (!) 56   Temp 98.2 ?F (36.8 ?C) (Oral)   Resp 20   Ht 5\' 5"  (1.651 m)   Wt 59 kg   LMP 01/01/2022 (Approximate)   SpO2 98%   BMI 21.63 kg/m?  ?Physical Exam ?Vitals and nursing note reviewed.  ?Constitutional:   ?   General: She is not in acute distress. ?   Appearance: She is not ill-appearing.  ?HENT:  ?   Head: Atraumatic.  ?Eyes:  ?   Conjunctiva/sclera: Conjunctivae normal.  ?Cardiovascular:  ?   Rate and Rhythm: Normal rate and regular rhythm.  ?   Pulses:     ?     Dorsalis pedis pulses are 2+ on the right side and 2+ on the left side.  ?     Posterior tibial pulses are 2+ on the right side and 2+ on the left side.  ?  Heart sounds: Normal heart sounds. No murmur heard. ?Pulmonary:  ?   Effort: Pulmonary effort is normal. No tachypnea or respiratory distress.  ?   Breath sounds: Normal breath sounds. No decreased breath sounds, wheezing or rales.  ?Abdominal:  ?   General: There is no distension.  ?Musculoskeletal:  ?   Cervical back: Normal range of motion and neck supple.  ?   Right lower leg: No tenderness. No edema.  ?   Left lower leg: No tenderness. No edema.  ?   Comments: No obvious acute injury  ?Skin: ?   General: Skin is warm.  ?Neurological:  ?   Mental Status: She is alert.  ?   Comments: Awake and alert, answers all questions appropriately.  Speech is not slurred.  ?Psychiatric:     ?   Mood and Affect: Mood normal.     ?   Behavior: Behavior normal.  ? ? ?ED Results / Procedures /  Treatments   ?Labs ?(all labs ordered are listed, but only abnormal results are displayed) ?Labs Reviewed  ?D-DIMER, QUANTITATIVE - Abnormal; Notable for the following components:  ?    Result Value  ? D-Dimer, Quant 0.66 (*)   ? All other components within normal limits  ?COMPREHENSIVE METABOLIC PANEL - Abnormal; Notable for the following components:  ? Glucose, Bld 69 (*)   ? Total Bilirubin 0.2 (*)   ? All other components within normal limits  ?PREGNANCY, URINE  ?CBC WITH DIFFERENTIAL/PLATELET  ?LIPASE, BLOOD  ?TROPONIN I (HIGH SENSITIVITY)  ?TROPONIN I (HIGH SENSITIVITY)  ? ? ?EKG ?EKG Interpretation ? ?Date/Time:  Monday Jan 14 2022 [id number] EDT ?Ventricular Rate:  57 ?PR Interval:  154 ?QRS Duration: 129 ?QT Interval:  448 ?QTC Calculation: 437 ?R Axis:   62 ?Text Interpretation: Sinus rhythm Right bundle branch block Confirmed by Octaviano Glow [phone]) on 01/14/2022 11:25:26 PM ? ?Radiology ?DG Chest 2 View ? ?Result Date: 01/14/2022 ?CLINICAL DATA:  Intermittent midsternal chest pain. EXAM: CHEST - 2 VIEW COMPARISON:  August 02, 2018 FINDINGS: Multiple sternal wires are present. The heart size and mediastinal contours are within normal limits. Both lungs are clear. There is mild dextroscoliosis of the lower thoracic spine, without evidence of an acute osseous abnormality. IMPRESSION: Stable exam without active cardiopulmonary disease. Electronically Signed   By: Virgina Norfolk M.D.   On: 01/14/2022 18:24  ? ?CT Angio Chest PE W and/or Wo Contrast ? ?Result Date: 01/14/2022 ?CLINICAL DATA:  Central chest pain for 1 week, initial encounter EXAM: CT ANGIOGRAPHY CHEST WITH CONTRAST TECHNIQUE: Multidetector CT imaging of the chest was performed using the standard protocol during bolus administration of intravenous contrast. Multiplanar CT image reconstructions and MIPs were obtained to evaluate the vascular anatomy. RADIATION DOSE REDUCTION: This exam was performed according to the departmental dose-optimization  program which includes automated exposure control, adjustment of the mA and/or kV according to patient size and/or use of iterative reconstruction technique. CONTRAST:  163mL OMNIPAQUE IOHEXOL 350 MG/ML SOLN COMPARISON:  Chest x-ray from earlier in the same day. FINDINGS: Cardiovascular: Thoracic aorta shows no aneurysmal dilatation or significant atherosclerotic calcifications. No cardiac enlargement is noted. The pulmonary artery is well visualized within normal branching pattern. No filling defect to suggest pulmonary embolism is noted. No coronary calcifications are seen. Mediastinum/Nodes: Thoracic inlet is within normal limits. The esophagus as visualized is within normal limits. No sizable hilar or mediastinal adenopathy is noted. Lungs/Pleura: Lungs are well aerated bilaterally. Minimal emphysematous changes are seen. No focal  infiltrate or effusion is noted. No parenchymal nodules are seen. Upper Abdomen: Visualized upper abdomen is within normal limits. Musculoskeletal: No acute rib abnormality is noted. No compression fracture is seen. Postsurgical changes in the sternum are noted. Review of the MIP images confirms the above findings. IMPRESSION: No evidence of pulmonary emboli. No acute abnormality noted. Emphysema (ICD10-J43.9). Electronically Signed   By: Inez Catalina M.D.   On: 01/14/2022 20:36   ? ?Procedures ?Procedures  ? ? ?Medications Ordered in ED ?Medications  ?iohexol (OMNIPAQUE) 350 MG/ML injection 100 mL (75 mLs Intravenous Contrast Given 01/14/22 2016)  ? ? ?ED Course/ Medical Decision Making/ A&P ?Clinical Course as of 01/15/22 1607  ?Mon Jan 14, 2022  ?2001 Patient had a brief episode of chest pain.  This lasted for about 2 to 3 minutes.  It was not as severe as her episode earlier.  Was unable to capture a twelve-lead during this time. [EH]  ?  ?Clinical Course User Index ?[EH] Lorin Glass, PA-C  ? ?                        ?Medical Decision Making ?Patient is a 41 year old woman  who presents today for evaluations of intermittent chest pain.  She is followed by cardiology due to her history of a bicuspid aortic valve, SVT status post ablation, and a VSD closure at age 55. ?Her pain las

## 2022-01-14 NOTE — Discharge Instructions (Signed)
If you develop any new or concerning symptoms please seek additional medical care and evaluation. ?If you develop pain or swelling in 1 or both of your lower legs please seek additional medical care and evaluation. ?Please make sure you are drinking plenty water and staying well-hydrated. ?

## 2022-01-20 NOTE — Progress Notes (Signed)
? ? ?Office Visit  ?  ?Patient Name: Kaylee Ellis ?Date of Encounter: 01/20/2022 ? ?Primary Care Provider:  Devra Dopp, MD ?Primary Cardiologist:  None ?Primary Electrophysiologist: None ? ?Patient Profile  ?  ?Chief Complaint: ER follow-up for atypical chest pain ? ? Notable History: ?VSD s/p repair as a child 49 ?History of PVCs s/p ablation RVOT ?Bicuspid aortic valve ?Hashimoto's disease ?Atypical chest pain ?SVT ? ? ? Recent Studies: ?2D echo 08/2021: EF 60-65%, normal LV function, no RWMA, normal PA systolic pressure, mild MV regurgitation with no evidence of mitral stenosis.  Bicuspid AV valve noted, no LVH ?08/2018 coronary CTA: Calcium score 0, normal coronary arteries, bicuspid aortic valve with fusion noted ?08/2018 event monitor: Predominant sinus PVCs 1% and PACs 1% ? ?History of Present Illness  ?  ?Kaylee Ellis is a 41 y.o. female with PMH of VSD s/p repair 1985, PVC ablation, bicuspid AV, atypical chest pain, Hashimoto's disease, SVT.  She was initially seen by Dr. Graciela Husbands in 2008 for complaint of palpitations.   In 2019 she was seen in the ED due to ankle injury with palpitations and heart rates ranging from 40s to 100s.  She also endorses shortness of breath while lifting weights.  Holter monitor with predominant sinus rhythm and 1% PVCs and PACs. ? ?She was first seen by Dr. Tenny Craw and 2020 and 2D echo repeated with similar findings from 2019.  She was seen last 08/2021 and reported no recurrence of PVCs.  She was advised that if PVC's reoccur that a low-dose beta-blocker may be trialed.  ? ?Since last being seen on 08/2021 Ms. Ofarrell presented to the ED on 01/14/2022 with complaint of nonradiating atypical left-sided chest pain.  EKGs performed and revealed no significant changes, CBC and CMP without clinical abnormalities.  D-dimer was obtained revealing 0.66 and CTA PE obtained without evidence of PE or acute intrathoracic abnormalities.  Her troponins and lipase were also  negative  ? ?She presents today for follow-up and reports no reoccurrence of chest pain since last Thursday.  She states that she has exercise and has run since that time with no chest pain, palpitations, dyspnea, PND, orthopnea, nausea, vomiting, dizziness, syncope, edema, weight gain, or early satiety.  She said during bouts of chest pain there was no associated increased heart rate and pain was sharper with breathing in particular inhalation.  The pain resolved on its own spontaneously without any positional changes or medication. ? ?Past Medical History  ?  ?Past Medical History:  ?Diagnosis Date  ? Acne   ? history of  ? Chest pain   ? history of, 2010  ? Hashimoto disease   ? history of  ? Hypothyroidism   ? SVT (supraventricular tachycardia) (HCC)   ? history of  ? VSD (ventricular septal defect)   ? history of  ? ?Past Surgical History:  ?Procedure Laterality Date  ? CARDIAC CATHETERIZATION    ? for SVT  ? CARDIAC ELECTROPHYSIOLOGY MAPPING AND ABLATION    ? DILATION AND CURETTAGE OF UTERUS    ? KNEE SURGERY Bilateral   ? VSD REPAIR    ? closure of ventricular septal defect  ? ? ?Allergies ? ?No Known Allergies ? ?Home Medications  ?  ?Current Outpatient Medications  ?Medication Sig Dispense Refill  ? clindamycin (CLEOCIN T) 1 % lotion Apply 1 application topically as directed.    ? metoprolol tartrate (LOPRESSOR) 50 MG tablet Take one tablet by mouth 2 hours prior to your cardiac  CTA. (Patient not taking: Reported on 08/22/2021) 2 tablet 0  ? Norethindrone Acetate-Ethinyl Estradiol (JUNEL,LOESTRIN,MICROGESTIN) 1.5-30 MG-MCG tablet Take 1 tablet by mouth daily.    ? spironolactone (ALDACTONE) 100 MG tablet Take 100 mg by mouth daily. with food    ? thyroid (ARMOUR) 90 MG tablet Take 1 tablet by mouth daily.    ? tretinoin (RETIN-A) 0.05 % cream Apply 1 application topically as directed. Acne 3x weekly    ? ?No current facility-administered medications for this visit.  ?  ? ?Review of Systems  ?Please see the  history of present illness.    ? ?All other systems reviewed and are otherwise negative except as noted above. ? ?Physical Exam  ?  ?Wt Readings from Last 3 Encounters:  ?01/14/22 130 lb (59 kg)  ?08/22/21 133 lb 9.6 oz (60.6 kg)  ?11/13/18 134 lb 9.6 oz (61.1 kg)  ? ?ZO:XWRUEVS:There were no vitals filed for this visit.,There is no height or weight on file to calculate BMI. ? ?Constitutional:   ?   Appearance: Healthy appearance. Not in distress.  ?Neck:  ?   Vascular: JVD normal.  ?Pulmonary:  ?   Effort: Pulmonary effort is normal.  ?   Breath sounds: No wheezing. No rales. Diminished in the bases ?Cardiovascular:  ?   Normal rate. Regular rhythm. Normal S1. Normal S2.   ?   Murmurs: There is no murmur.  ?Edema: ?   Peripheral edema absent.  ?Abdominal:  ?   Palpations: Abdomen is soft non tender. There is no hepatomegaly.  ?Skin: ?   General: Skin is warm and dry.  ?Neurological:  ?   General: No focal deficit present.  ?   Mental Status: Alert and oriented to person, place and time.  ?   Cranial Nerves: Cranial nerves are intact.  ?EKG/LABS/Other Studies Reviewed  ?  ?ECG personally reviewed by me today -none completed today ? ?Lab Results  ?Component Value Date  ? WBC 6.3 01/14/2022  ? HGB 12.7 01/14/2022  ? HCT 38.5 01/14/2022  ? MCV 89.3 01/14/2022  ? PLT 209 01/14/2022  ? ?Lab Results  ?Component Value Date  ? CREATININE 0.74 01/14/2022  ? BUN 15 01/14/2022  ? NA 138 01/14/2022  ? K 3.7 01/14/2022  ? CL 102 01/14/2022  ? CO2 27 01/14/2022  ? ?Lab Results  ?Component Value Date  ? ALT 18 01/14/2022  ? AST 18 01/14/2022  ? ALKPHOS 52 01/14/2022  ? BILITOT 0.2 (L) 01/14/2022  ? ?No results found for: CHOL, HDL, LDLCALC, LDLDIRECT, TRIG, CHOLHDL  ?No results found for: HGBA1C ? ?Assessment & Plan  ?  ?1.  Atypical chest pain: ?-Patient reported to ED on 5/1 with complaint of midsternal sharp pain.  EKG and troponins reassuring for nonischemic cause.  CTA PE completed for D-dimer of 0.66 and revealed no evidence of PE  or intrathoracic abnormalities. ?-Today patient reports no chest pain or associated symptoms related to recent ED admission. ?-We will arrange for exercise treadmill test to rule out possible ischemic changes and for reassurance. ? ?2.  s/p VSD closure: ?-Completed in [place] in 1985 ?-2D echo completed 08/2021 no stenosis and aortic insufficiency remains mild ? ?3.  Palpitations: ?-Predominant sinus PVCs 1% and PACs 1% on event monitor in 2019 ?-Patient reports no consistent or sustained palpitations. ? ?Disposition: Follow-up with Dr. Tenny Crawoss or APP in 1 months ? ?Shared Decision Making/Informed Consent ?The risks [chest pain, shortness of breath, cardiac arrhythmias, dizziness, blood pressure fluctuations, myocardial  infarction, stroke/transient ischemic attack, and life-threatening complications (estimated to be 1 in 10,000)], benefits (risk stratification, diagnosing coronary artery disease, treatment guidance) and alternatives of an exercise tolerance test were discussed in detail with Ms. Levels and she agrees to proceed.  ?   ?Medication Adjustments/Labs and Tests Ordered: ?Current medicines are reviewed at length with the patient today.  Concerns regarding medicines are outlined above.  ? ?Signed, ?Napoleon Form, Leodis Rains, NP ?01/20/2022, 5:55 PM ?[facility] ?

## 2022-01-21 ENCOUNTER — Ambulatory Visit: Payer: 59 | Admitting: Physician Assistant

## 2022-01-22 ENCOUNTER — Ambulatory Visit: Payer: 59 | Admitting: Nurse Practitioner

## 2022-01-22 ENCOUNTER — Encounter: Payer: Self-pay | Admitting: Nurse Practitioner

## 2022-01-22 VITALS — BP 120/70 | HR 58 | Ht 65.0 in | Wt 135.2 lb

## 2022-01-22 DIAGNOSIS — Z8774 Personal history of (corrected) congenital malformations of heart and circulatory system: Secondary | ICD-10-CM

## 2022-01-22 DIAGNOSIS — R002 Palpitations: Secondary | ICD-10-CM

## 2022-01-22 DIAGNOSIS — R0789 Other chest pain: Secondary | ICD-10-CM | POA: Diagnosis not present

## 2022-01-22 NOTE — Patient Instructions (Addendum)
Medication Instructions:  ?Your physician recommends that you continue on your current medications as directed. Please refer to the Current Medication list given to you today. ? ?*If you need a refill on your cardiac medications before your next appointment, please call your pharmacy* ? ? ?Lab Work: ?None ordered ? ?If you have labs (blood work) drawn today and your tests are completely normal, you will receive your results only by: ?MyChart Message (if you have MyChart) OR ?A paper copy in the mail ?If you have any lab test that is abnormal or we need to change your treatment, we will call you to review the results. ? ? ?Testing/Procedures: ?Your physician has requested that you have an exercise tolerance test. For further information please visit [email or web address] Please also follow instruction sheet,  BELOW: ? ?- DO NOT EAT, DRINK, OR USE TOBACCO PRODUCTS WITHIN 4 HOURS OF THE TEST ?- DRESS PREPARED TO EXERCISE IN A COMFORTABLE, 2 PIECE CLOTHING OUTFIT AND WALKING SHOES ?- BRING ANY PRESCRIPTION MEDICATIONS WITH YOU  ?- NOTIFY THE OFFICE 24 HOURS IN ADVANCE IF YOU NEED TO CANCEL ?- CALL THE OFFICE [phone] IF YOU HAVE ANY QUESTIONS ? ? ? ? ?Follow-Up: ?At [facility], you and your health needs are our priority.  As part of our continuing mission to provide you with exceptional heart care, we have created designated Provider Care Teams.  These Care Teams include your primary Cardiologist (physician) and Advanced Practice Providers (APPs -  Physician Assistants and Nurse Practitioners) who all work together to provide you with the care you need, when you need it. ? ?We recommend signing up for the patient portal called "MyChart".  Sign up information is provided on this After Visit Summary.  MyChart is used to connect with patients for Virtual Visits (Telemedicine).  Patients are able to view lab/test results, encounter notes, upcoming appointments, etc.  Non-urgent messages can be sent to your provider  as well.   ?To learn more about what you can do with MyChart, go to [email or web address].   ? ?Your next appointment:   ?1 month ? ?The format for your next appointment:   ?In Person ? ?Provider:   ?Dietrich Pates, MD  or APP ? ? ?Other Instructions ? ? ?Important Information About Sugar ? ? ? ? ?  ?

## 2022-02-12 ENCOUNTER — Ambulatory Visit (INDEPENDENT_AMBULATORY_CARE_PROVIDER_SITE_OTHER): Payer: 59

## 2022-02-12 DIAGNOSIS — R0789 Other chest pain: Secondary | ICD-10-CM | POA: Diagnosis not present

## 2022-02-12 LAB — EXERCISE TOLERANCE TEST
Angina Index: 0
Duke Treadmill Score: 3
Estimated workload: 15.5
Exercise duration (min): 13 min
Exercise duration (sec): 8 s
MPHR: 180 {beats}/min
Peak HR: 155 {beats}/min
Percent HR: 86 %
RPE: 15
Rest HR: 52 {beats}/min
ST Depression (mm): 2 mm

## 2022-02-13 ENCOUNTER — Encounter: Payer: Self-pay | Admitting: Internal Medicine

## 2022-02-13 DIAGNOSIS — R9431 Abnormal electrocardiogram [ECG] [EKG]: Secondary | ICD-10-CM

## 2022-02-13 NOTE — Telephone Encounter (Signed)
I spoke with the pt and she verbalized understanding of her Stress Myoview instructions... she will let us know if she has any further questions.

## 2022-02-13 NOTE — Progress Notes (Deleted)
Cardiology Office Note    Date:  02/13/2022   ID:  [name], DOB 1981/06/30, MRN [id number]   PCP:  Helane Rima, [place]  Cardiologist:  Dorris Carnes, MD *** Advanced Practice Provider:  No care team member to display Electrophysiologist:  None   [phone]   No chief complaint on file.   History of Present Illness:  Kaylee Ellis is a 41 y.o. female  with PMH of VSD s/p repair 1985, PVC ablation, bicuspid AV, atypical chest pain, Hashimoto's disease, SVT.  She was initially seen by Dr. Caryl Comes in 2008 for complaint of palpitations.   In 2019 she was seen in the ED due to ankle injury with palpitations and heart rates ranging from 40s to 100s.  She also endorses shortness of breath while lifting weights.  Holter monitor with predominant sinus rhythm and 1% PVCs and PACs.   She was first seen by Dr. Harrington Challenger and 2020 and 2D echo repeated with similar findings from 2019.  She was seen last 08/2021 and reported no recurrence of PVCs.  She was advised that if PVC's reoccur that a low-dose beta-blocker may be trialed.    ED on 01/14/2022 with complaint of nonradiating atypical left-sided chest pain.  EKGs performed and revealed no significant changes, CBC and CMP without clinical abnormalities.  D-dimer was obtained revealing 0.66 and CTA PE obtained without evidence of PE or acute intrathoracic abnormalities.  Her troponins and lipase were also negative.  Patient saw Ambrose Pancoast, PA-C 01/2022 and GXT 02/12/22 exercised 13 min 2.0 mm ST depression at minute 12, returned to baseline after 1 min in recovery. GXT myoview ordered.  Past Medical History:  Diagnosis Date   Acne    history of   Chest pain    history of, 2010   Hashimoto disease    history of   Hypothyroidism    SVT (supraventricular tachycardia) ([place])    history of   VSD (ventricular septal defect)    history of    Past Surgical History:  Procedure Laterality Date   CARDIAC  CATHETERIZATION     for SVT   CARDIAC ELECTROPHYSIOLOGY MAPPING AND ABLATION     DILATION AND CURETTAGE OF UTERUS     KNEE SURGERY Bilateral    VSD REPAIR     closure of ventricular septal defect    Current Medications: No outpatient medications have been marked as taking for the 02/26/22 encounter (Appointment) with Imogene Burn, PA-C.     Allergies:   Patient has no known allergies.   Social History   Socioeconomic History   Marital status: Married    Spouse name: Not on file   Number of children: Not on file   Years of education: Not on file   Highest education level: Not on file  Occupational History   Not on file  Tobacco Use   Smoking status: Former   Smokeless tobacco: Never  Vaping Use   Vaping Use: Never used  Substance and Sexual Activity   Alcohol use: Yes    Comment: occasional  NONE WHILE PREG   Drug use: No   Sexual activity: Not Currently  Other Topics Concern   Not on file  Social History Narrative   Not on file   Social Determinants of Health   Financial Resource Strain: Not on file  Food Insecurity: Not on file  Transportation Needs: Not on file  Physical Activity: Not on file  Stress:  Not on file  Social Connections: Not on file     Family History:  The patient's ***family history includes Breast cancer in her cousin; Cancer in her paternal uncle.   ROS:   Please see the history of present illness.    ROS All other systems reviewed and are negative.   PHYSICAL EXAM:   VS:  There were no vitals taken for this visit.  Physical Exam  GEN: Well nourished, well developed, in no acute distress  HEENT: normal  Neck: no JVD, carotid bruits, or masses Cardiac:RRR; no murmurs, rubs, or gallops  Respiratory:  clear to auscultation bilaterally, normal work of breathing GI: soft, nontender, nondistended, + BS Ext: without cyanosis, clubbing, or edema, Good distal pulses bilaterally MS: no deformity or atrophy  Skin: warm and dry, no  rash Neuro:  Alert and Oriented x 3, Strength and sensation are intact Psych: euthymic mood, full affect  Wt Readings from Last 3 Encounters:  01/22/22 135 lb 3.2 oz (61.3 kg)  01/14/22 130 lb (59 kg)  08/22/21 133 lb 9.6 oz (60.6 kg)      Studies/Labs Reviewed:   EKG:  EKG is*** ordered today.  The ekg ordered today demonstrates ***  Recent Labs: 01/14/2022: ALT 18; BUN 15; Creatinine, Ser 0.74; Hemoglobin 12.7; Platelets 209; Potassium 3.7; Sodium 138   Lipid Panel No results found for: CHOL, TRIG, HDL, CHOLHDL, VLDL, LDLCALC, LDLDIRECT  Additional studies/ records that were reviewed today include:  GXT 02/12/22   A Bruce protocol stress test was performed. Exercise capacity was excellent. Patient exercised for 13 min and 8 sec. Maximum HR of 155 bpm. MPHR 86.0 %. Peak METS 15.5 . The patient experienced no angina during the test. The patient achieved the target heart rate. The patient requested the test to be stopped. The patient reported no symptoms during the stress test. Normal blood pressure and normal heart rate response noted during stress. Heart rate recovery was normal.   2.0 mm of horizontal ST depression (V4, V5 and V6) was noted. ST depression began at minute 12. ST deviation returned to baseline after less than 1 minute of recovery. Arrhythmias during stress: occasional PVCs. Arrhythmias during recovery: occasional PVCs. The ECG was positive for ischemia. Consider imaging stress to further evaluate.   Prior study not available for comparison.   2D echo 08/2021: EF 60-65%, normal LV function, no RWMA, normal PA systolic pressure, mild MV regurgitation with no evidence of mitral stenosis.  Bicuspid AV valve noted, no LVH 08/2018 coronary CTA: Calcium score 0, normal coronary arteries, bicuspid aortic valve with fusion noted 08/2018 event monitor: Predominant sinus PVCs 1% and PACs 1%    Risk Assessment/Calculations:   {Does this patient have ATRIAL  FIBRILLATION?:[phone]}     ASSESSMENT:    1. Chest pain, unspecified type   2. S/P VSD closure   3. Palpitations   4. Bicuspid aortic valve      PLAN:  In order of problems listed above:  Chest pain with GXT 02/12/22 positive with 2 mm ST depression 12 min into exercise. GXT myoview ordered  VSD closure 1985 in [place], echo 08/2021 stable  Palpitations with history of PVC ablation 1% PVC and PAC's on monitor 2019  Bicuspid Aortic valve  Shared Decision Making/Informed Consent   {Are you ordering a CV Procedure (e.g. stress test, cath, DCCV, TEE, etc)?   Press F2        :[id number]    Medication Adjustments/Labs and Tests Ordered: Current medicines are reviewed  at length with the patient today.  Concerns regarding medicines are outlined above.  Medication changes, Labs and Tests ordered today are listed in the Patient Instructions below. There are no Patient Instructions on file for this visit.   Sumner Boast, PA-C  02/13/2022 3:49 PM    [facility] [address], [place], [place]  [address] Phone: [phone]; Fax: [phone]

## 2022-02-14 ENCOUNTER — Encounter (HOSPITAL_COMMUNITY): Payer: Self-pay | Admitting: Nurse Practitioner

## 2022-02-14 NOTE — Telephone Encounter (Signed)
I have contacted patient today and reviewed her symptoms and the stress test  Pt had CP that was pleuritic    When she had it, it was not associated with activity   It has resolved  We reviewed exercise test    She went over 13 min.   EKG was positive but it resolve quickly, with 15 seconds of recovery  Specificity limited Discussed possible false positivity  Given that she is feeling good, I would not pursue further testing    Stay active    Call if symptoms change  Will cancel myoview that is scheduled for next week

## 2022-02-18 ENCOUNTER — Encounter (HOSPITAL_COMMUNITY): Payer: 59

## 2022-02-26 ENCOUNTER — Ambulatory Visit: Payer: 59 | Admitting: Physician Assistant

## 2022-02-26 ENCOUNTER — Telehealth: Payer: Self-pay | Admitting: Internal Medicine

## 2022-02-26 DIAGNOSIS — Z8774 Personal history of (corrected) congenital malformations of heart and circulatory system: Secondary | ICD-10-CM

## 2022-02-26 DIAGNOSIS — R002 Palpitations: Secondary | ICD-10-CM

## 2022-02-26 DIAGNOSIS — R079 Chest pain, unspecified: Secondary | ICD-10-CM

## 2022-02-26 DIAGNOSIS — Q231 Congenital insufficiency of aortic valve: Secondary | ICD-10-CM

## 2022-02-26 NOTE — Telephone Encounter (Signed)
Patient would like to get a second opinion from Dr. Gwenlyn Found. Patient would like to see if she would be able to see Dr. Gwenlyn Found.

## 2022-02-26 NOTE — Telephone Encounter (Signed)
Patient wants to see Dr. Gwenlyn Found for second opinion on ETT and treatment plan. Scheduled for 6/21.

## 2022-03-06 ENCOUNTER — Encounter: Payer: Self-pay | Admitting: Cardiovascular Disease

## 2022-03-06 ENCOUNTER — Ambulatory Visit (INDEPENDENT_AMBULATORY_CARE_PROVIDER_SITE_OTHER): Payer: 59 | Admitting: Cardiovascular Disease

## 2022-03-06 DIAGNOSIS — E782 Mixed hyperlipidemia: Secondary | ICD-10-CM

## 2022-03-06 DIAGNOSIS — Z8774 Personal history of (corrected) congenital malformations of heart and circulatory system: Secondary | ICD-10-CM

## 2022-03-06 DIAGNOSIS — E785 Hyperlipidemia, unspecified: Secondary | ICD-10-CM | POA: Insufficient documentation

## 2022-03-06 DIAGNOSIS — I471 Supraventricular tachycardia: Secondary | ICD-10-CM

## 2022-03-06 DIAGNOSIS — R9431 Abnormal electrocardiogram [ECG] [EKG]: Secondary | ICD-10-CM

## 2022-03-06 DIAGNOSIS — Q231 Congenital insufficiency of aortic valve: Secondary | ICD-10-CM

## 2022-03-06 NOTE — Assessment & Plan Note (Signed)
Kaylee Ellis had a GXT performed on 02/12/2022 which showed ST segment depression.  This was ordered because of an episode of chest pain that brought her to the ER on 01/14/2022.  Her D-dimer was mildly elevated at that time, CTA was negative for PE.  She did have a coronary CTA performed 08/26/2018 revealing completely normal coronary arteries with a coronary calcium score of 0.  I think in all likelihood this is a false positive GXT.  She has no risk factors for coronary disease.  Her chest pain sounded noncardiac and atypical.  No further evaluation is warranted at this time.

## 2022-03-06 NOTE — Assessment & Plan Note (Signed)
History of VSD closure at age 41 in [place].  2D echo performed 09/13/2021 showed no evidence of a patent VSD.

## 2022-03-06 NOTE — Assessment & Plan Note (Signed)
History of bicuspid aortic valve with recent 2D echo performed 09/13/2021 revealing fusion of the right coronary cusp and noncoronary cusp without evidence of aortic stenosis.

## 2022-03-06 NOTE — Assessment & Plan Note (Signed)
History of SVT ablation in 2003 [place].

## 2022-03-06 NOTE — Patient Instructions (Signed)

## 2022-03-06 NOTE — Assessment & Plan Note (Signed)
History of mild hyperlipidemia lipid profile performed 09/13/2021 revealing total cholesterol 217, LDL 127 and HDL of 75.  Given her coronary calcium score is 0 and high HDL.  I do not think she needs to be on a statin drug at this time.

## 2022-03-06 NOTE — Progress Notes (Signed)
03/06/2022 Kaylee Ellis   [birth year]  [id number]  Primary Physician Devra Dopp, MD Primary Cardiologist: Runell Gess MD Nicholes Calam[place], [place]  HPI:  Kaylee Ellis is a 41 y.o. thin and fit appearing married Caucasian female mother of 3 children patient of Dr. Tenny Craw' who is self referred for a second opinion because of a recent abnormal GXT.  She works as a Engineer, technical sales for [employer].  She has no cardiac risk factors.  She did have a VSD repair at age 56 and titrate.  She had an SVT ablation in 2003 in [place] as well.  She exercises vigorously and frequently.  She did have a 2D echo performed 09/13/2021 that revealed normal LV systolic function with mild MR and mild AI.  She had forme frust  of bicuspid aortic valve without evidence of stenosis.  She had a coronary CTA performed 08/26/2018 that was entirely normal with a coronary calcium score of 0.  She was seen in the ER on 01/14/2022 with atypical chest pain.  Her D-dimer was mildly elevated at 0.66.  Chest CT was negative for PE.  She did have a GXT performed 02/13/2022 that showed some mild ST segment depression.   Current Meds  Medication Sig   clindamycin (CLEOCIN T) 1 % lotion Apply 1 application topically as directed.   Norethindrone Acetate-Ethinyl Estradiol (JUNEL,LOESTRIN,MICROGESTIN) 1.5-30 MG-MCG tablet Take 1 tablet by mouth daily.   spironolactone (ALDACTONE) 100 MG tablet Take 100 mg by mouth daily. with food   thyroid (ARMOUR) 15 MG tablet TAKE ONE TABLET (15 MG DOSE) DAILY. ADD TO THE NP THYROID 90 MG DAILY FOR A TOTAL OF 105 MG DAILY.   thyroid (ARMOUR) 90 MG tablet Take 1 tablet by mouth daily.   tretinoin (RETIN-A) 0.05 % cream Apply 1 application topically as directed. Acne 3x weekly     No Known Allergies  Social History   Socioeconomic History   Marital status: Married    Spouse name: Not on file   Number of children: Not on file   Years of education: Not on file   Highest education  level: Not on file  Occupational History   Not on file  Tobacco Use   Smoking status: Former   Smokeless tobacco: Never  Vaping Use   Vaping Use: Never used  Substance and Sexual Activity   Alcohol use: Yes    Comment: occasional  NONE WHILE PREG   Drug use: No   Sexual activity: Not Currently  Other Topics Concern   Not on file  Social History Narrative   Not on file   Social Determinants of Health   Financial Resource Strain: Not on file  Food Insecurity: Not on file  Transportation Needs: Not on file  Physical Activity: Not on file  Stress: Not on file  Social Connections: Not on file  Intimate Partner Violence: Not on file     Review of Systems: General: negative for chills, fever, night sweats or weight changes.  Cardiovascular: negative for chest pain, dyspnea on exertion, edema, orthopnea, palpitations, paroxysmal nocturnal dyspnea or shortness of breath Dermatological: negative for rash Respiratory: negative for cough or wheezing Urologic: negative for hematuria Abdominal: negative for nausea, vomiting, diarrhea, bright red blood per rectum, melena, or hematemesis Neurologic: negative for visual changes, syncope, or dizziness All other systems reviewed and are otherwise negative except as noted above.    Blood pressure 106/64, pulse (!) 59, height 5\' 5"  (1.651 m), weight 134  lb 3.2 oz (60.9 kg), SpO2 98 %.  General appearance: alert and no distress Neck: no adenopathy, no carotid bruit, no JVD, supple, symmetrical, trachea midline, and thyroid not enlarged, symmetric, no tenderness/mass/nodules Lungs: clear to auscultation bilaterally Heart: regular rate and rhythm, S1, S2 normal, no murmur, click, rub or gallop Extremities: extremities normal, atraumatic, no cyanosis or edema Pulses: 2+ and symmetric Skin: Skin color, texture, turgor normal. No rashes or lesions Neurologic: Grossly normal  EKG not performed today  ASSESSMENT AND PLAN:   S/P VSD  closure History of VSD closure at age 52 in [place].  2D echo performed 09/13/2021 showed no evidence of a patent VSD.  SVT (supraventricular tachycardia) (HCC) History of SVT ablation in 2003 [place].  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  Abnormal ECG during exercise stress test Ms. Messing had a GXT performed on 02/12/2022 which showed ST segment depression.  This was ordered because of an episode of chest pain that brought her to the ER on 01/14/2022.  Her D-dimer was mildly elevated at that time, CTA was negative for PE.  She did have a coronary CTA performed 08/26/2018 revealing completely normal coronary arteries with a coronary calcium score of 0.  I think in all likelihood this is a false positive GXT.  She has no risk factors for coronary disease.  Her chest pain sounded noncardiac and atypical.  No further evaluation is warranted at this time.  Hyperlipidemia History of mild hyperlipidemia lipid profile performed 09/13/2021 revealing total cholesterol 217, LDL 127 and HDL of 75.  Given her coronary calcium score is 0 and high HDL.  I do not think she needs to be on a statin drug at this time.     Runell Gess MD FACP,FACC,FAHA, [facility] 03/06/2022 3:46 PM

## 2023-02-17 ENCOUNTER — Other Ambulatory Visit: Payer: Self-pay | Admitting: Obstetrics and Gynecology

## 2023-02-17 DIAGNOSIS — Z1231 Encounter for screening mammogram for malignant neoplasm of breast: Secondary | ICD-10-CM

## 2023-02-19 ENCOUNTER — Ambulatory Visit
Admission: RE | Admit: 2023-02-19 | Discharge: 2023-02-19 | Disposition: A | Discharge status: Home / Self Care | Payer: Commercial Managed Care - PPO | Source: Ambulatory Visit | Attending: Obstetrics and Gynecology | Admitting: Obstetrics and Gynecology

## 2023-02-19 DIAGNOSIS — Z1231 Encounter for screening mammogram for malignant neoplasm of breast: Secondary | ICD-10-CM

## 2024-01-19 ENCOUNTER — Encounter: Payer: Self-pay | Admitting: Internal Medicine

## 2024-05-10 ENCOUNTER — Other Ambulatory Visit: Payer: Self-pay | Admitting: Obstetrics and Gynecology

## 2024-05-10 DIAGNOSIS — Z1231 Encounter for screening mammogram for malignant neoplasm of breast: Secondary | ICD-10-CM

## 2024-05-21 ENCOUNTER — Ambulatory Visit

## 2024-05-24 ENCOUNTER — Ambulatory Visit
Admission: RE | Admit: 2024-05-24 | Discharge: 2024-05-24 | Disposition: A | Discharge status: Home / Self Care | Source: Ambulatory Visit | Attending: Obstetrics and Gynecology | Admitting: Obstetrics and Gynecology

## 2024-05-24 DIAGNOSIS — Z1231 Encounter for screening mammogram for malignant neoplasm of breast: Secondary | ICD-10-CM

## 2024-05-28 ENCOUNTER — Other Ambulatory Visit: Payer: Self-pay | Admitting: Obstetrics and Gynecology

## 2024-05-28 DIAGNOSIS — R928 Other abnormal and inconclusive findings on diagnostic imaging of breast: Secondary | ICD-10-CM

## 2024-06-03 ENCOUNTER — Ambulatory Visit
Admission: RE | Admit: 2024-06-03 | Discharge: 2024-06-03 | Disposition: A | Discharge status: Home / Self Care | Source: Ambulatory Visit | Attending: Obstetrics and Gynecology | Admitting: Obstetrics and Gynecology

## 2024-06-03 ENCOUNTER — Other Ambulatory Visit: Payer: Self-pay | Admitting: Obstetrics and Gynecology

## 2024-06-03 DIAGNOSIS — R928 Other abnormal and inconclusive findings on diagnostic imaging of breast: Secondary | ICD-10-CM

## 2024-06-03 DIAGNOSIS — R921 Mammographic calcification found on diagnostic imaging of breast: Secondary | ICD-10-CM

## 2024-06-07 ENCOUNTER — Ambulatory Visit
Admission: RE | Admit: 2024-06-07 | Discharge: 2024-06-07 | Disposition: A | Discharge status: Home / Self Care | Source: Ambulatory Visit | Attending: Obstetrics and Gynecology | Admitting: Obstetrics and Gynecology

## 2024-06-07 DIAGNOSIS — R921 Mammographic calcification found on diagnostic imaging of breast: Secondary | ICD-10-CM

## 2024-06-07 HISTORY — PX: BREAST BIOPSY: SHX20

## 2024-06-09 LAB — SURGICAL PATHOLOGY
